# Patient Record
Sex: Female | Born: 1950 | Race: White | Hispanic: No | Marital: Married | Smoking: Former smoker
Health system: Southern US, Community
[De-identification: ages and names within clinical notes are randomized; demographics above are authoritative.]

## PROBLEM LIST (undated history)

## (undated) DIAGNOSIS — E7800 Pure hypercholesterolemia, unspecified: Secondary | ICD-10-CM

## (undated) DIAGNOSIS — I209 Angina pectoris, unspecified: Secondary | ICD-10-CM

## (undated) DIAGNOSIS — M1990 Unspecified osteoarthritis, unspecified site: Secondary | ICD-10-CM

## (undated) DIAGNOSIS — E669 Obesity, unspecified: Secondary | ICD-10-CM

## (undated) DIAGNOSIS — R011 Cardiac murmur, unspecified: Secondary | ICD-10-CM

## (undated) DIAGNOSIS — I10 Essential (primary) hypertension: Secondary | ICD-10-CM

## (undated) DIAGNOSIS — R7303 Prediabetes: Secondary | ICD-10-CM

## (undated) HISTORY — PX: JOINT REPLACEMENT: SHX530

## (undated) HISTORY — PX: TUBAL LIGATION: SHX77

## (undated) HISTORY — PX: UTERINE FIBROID SURGERY: SHX826

## (undated) HISTORY — PX: OTHER SURGICAL HISTORY: SHX169

## (undated) HISTORY — PX: EYE SURGERY: SHX253

---

## 1984-11-21 HISTORY — PX: TUBAL LIGATION: SHX77

## 2008-11-21 HISTORY — PX: COLONOSCOPY: SHX174

## 2015-08-22 HISTORY — PX: EYE SURGERY: SHX253

## 2015-08-22 HISTORY — PX: CATARACT EXTRACTION: SUR2

## 2017-06-22 ENCOUNTER — Other Ambulatory Visit: Payer: Self-pay | Admitting: Family Medicine

## 2017-06-22 DIAGNOSIS — Z1231 Encounter for screening mammogram for malignant neoplasm of breast: Secondary | ICD-10-CM

## 2017-07-06 ENCOUNTER — Ambulatory Visit
Admission: RE | Admit: 2017-07-06 | Discharge: 2017-07-06 | Disposition: A | Discharge status: Home / Self Care | Payer: Medicare HMO | Source: Ambulatory Visit | Attending: Family Medicine | Admitting: Family Medicine

## 2017-07-06 DIAGNOSIS — R928 Other abnormal and inconclusive findings on diagnostic imaging of breast: Secondary | ICD-10-CM | POA: Diagnosis not present

## 2017-07-06 DIAGNOSIS — Z1231 Encounter for screening mammogram for malignant neoplasm of breast: Secondary | ICD-10-CM | POA: Diagnosis not present

## 2017-07-27 ENCOUNTER — Other Ambulatory Visit: Payer: Self-pay | Admitting: Family Medicine

## 2017-07-27 DIAGNOSIS — N6320 Unspecified lump in the left breast, unspecified quadrant: Secondary | ICD-10-CM

## 2017-07-27 DIAGNOSIS — N6310 Unspecified lump in the right breast, unspecified quadrant: Secondary | ICD-10-CM

## 2017-07-27 DIAGNOSIS — R928 Other abnormal and inconclusive findings on diagnostic imaging of breast: Secondary | ICD-10-CM

## 2017-08-04 ENCOUNTER — Ambulatory Visit
Admission: RE | Admit: 2017-08-04 | Discharge: 2017-08-04 | Disposition: A | Discharge status: Home / Self Care | Payer: Medicare HMO | Source: Ambulatory Visit | Attending: Family Medicine | Admitting: Family Medicine

## 2017-08-04 ENCOUNTER — Other Ambulatory Visit: Payer: Self-pay | Admitting: Family Medicine

## 2017-08-04 DIAGNOSIS — R928 Other abnormal and inconclusive findings on diagnostic imaging of breast: Secondary | ICD-10-CM

## 2017-08-04 DIAGNOSIS — N6320 Unspecified lump in the left breast, unspecified quadrant: Secondary | ICD-10-CM

## 2017-08-04 DIAGNOSIS — N6310 Unspecified lump in the right breast, unspecified quadrant: Secondary | ICD-10-CM | POA: Diagnosis present

## 2017-08-04 DIAGNOSIS — N6321 Unspecified lump in the left breast, upper outer quadrant: Secondary | ICD-10-CM | POA: Diagnosis not present

## 2017-08-04 HISTORY — PX: BREAST BIOPSY: SHX20

## 2017-08-08 LAB — SURGICAL PATHOLOGY

## 2017-08-11 ENCOUNTER — Encounter: Payer: Self-pay | Admitting: *Deleted

## 2017-08-14 ENCOUNTER — Ambulatory Visit
Admission: RE | Admit: 2017-08-14 | Discharge: 2017-08-14 | Disposition: A | Discharge status: Home / Self Care | Payer: Medicare HMO | Source: Ambulatory Visit | Attending: Unknown Physician Specialty | Admitting: Unknown Physician Specialty

## 2017-08-14 ENCOUNTER — Encounter
Admission: RE | Disposition: A | Discharge status: Home / Self Care | Payer: Self-pay | Source: Ambulatory Visit | Attending: Unknown Physician Specialty

## 2017-08-14 ENCOUNTER — Ambulatory Visit: Payer: Medicare HMO | Admitting: Anesthesiology

## 2017-08-14 ENCOUNTER — Encounter: Payer: Self-pay | Admitting: *Deleted

## 2017-08-14 DIAGNOSIS — K640 First degree hemorrhoids: Secondary | ICD-10-CM | POA: Insufficient documentation

## 2017-08-14 DIAGNOSIS — Z1211 Encounter for screening for malignant neoplasm of colon: Secondary | ICD-10-CM | POA: Diagnosis present

## 2017-08-14 DIAGNOSIS — D125 Benign neoplasm of sigmoid colon: Secondary | ICD-10-CM | POA: Diagnosis not present

## 2017-08-14 DIAGNOSIS — K635 Polyp of colon: Secondary | ICD-10-CM | POA: Insufficient documentation

## 2017-08-14 DIAGNOSIS — Z87891 Personal history of nicotine dependence: Secondary | ICD-10-CM | POA: Diagnosis not present

## 2017-08-14 DIAGNOSIS — E7800 Pure hypercholesterolemia, unspecified: Secondary | ICD-10-CM | POA: Insufficient documentation

## 2017-08-14 DIAGNOSIS — I10 Essential (primary) hypertension: Secondary | ICD-10-CM | POA: Insufficient documentation

## 2017-08-14 DIAGNOSIS — R7303 Prediabetes: Secondary | ICD-10-CM | POA: Insufficient documentation

## 2017-08-14 DIAGNOSIS — Z79899 Other long term (current) drug therapy: Secondary | ICD-10-CM | POA: Insufficient documentation

## 2017-08-14 DIAGNOSIS — Z6830 Body mass index (BMI) 30.0-30.9, adult: Secondary | ICD-10-CM | POA: Insufficient documentation

## 2017-08-14 DIAGNOSIS — Z800 Family history of malignant neoplasm of digestive organs: Secondary | ICD-10-CM | POA: Diagnosis not present

## 2017-08-14 DIAGNOSIS — E669 Obesity, unspecified: Secondary | ICD-10-CM | POA: Diagnosis not present

## 2017-08-14 HISTORY — DX: Pure hypercholesterolemia, unspecified: E78.00

## 2017-08-14 HISTORY — DX: Prediabetes: R73.03

## 2017-08-14 HISTORY — DX: Obesity, unspecified: E66.9

## 2017-08-14 HISTORY — DX: Essential (primary) hypertension: I10

## 2017-08-14 HISTORY — PX: COLONOSCOPY WITH PROPOFOL: SHX5780

## 2017-08-14 SURGERY — COLONOSCOPY WITH PROPOFOL
Anesthesia: General

## 2017-08-14 MED ORDER — PROPOFOL 500 MG/50ML IV EMUL
INTRAVENOUS | Status: DC | PRN
  Administered 2017-08-14: 110 ug/kg/min via INTRAVENOUS

## 2017-08-14 MED ORDER — SODIUM CHLORIDE 0.9 % IV SOLN
INTRAVENOUS | Status: DC
  Administered 2017-08-14: 1000 mL via INTRAVENOUS

## 2017-08-14 MED ORDER — PROPOFOL 10 MG/ML IV BOLUS
INTRAVENOUS | Status: DC | PRN
  Administered 2017-08-14: 80 mg via INTRAVENOUS

## 2017-08-14 NOTE — H&P (Signed)
   Primary Care Physician:  Katheren Shams Primary Gastroenterologist:  Dr. Vira Agar  Pre-Procedure History & Physical: HPI:  Tracy Hogan is a 66 y.o. female is here for an colonoscopy.   Past Medical History:  Diagnosis Date  . Hypercholesterolemia   . Hypertension   . Obesity (BMI 30-39.9)   . Pre-diabetes     Past Surgical History:  Procedure Laterality Date  . BREAST BIOPSY Left 08/04/2017   left [place] bx path pending  . EYE SURGERY    . TUBAL LIGATION    . uterine fibroid tumor extraction      Prior to Admission medications   Medication Sig Start Date End Date Taking? Authorizing Provider  Cholecalciferol 1000 units tablet Take 1,000 Units by mouth daily.   Yes [provider]  lisinopril (PRINIVIL,ZESTRIL) 10 MG tablet Take 10 mg by mouth daily.   Yes [provider]  loratadine (CLARITIN) 10 MG tablet Take 10 mg by mouth daily.   Yes [provider]  Multiple Vitamin (MULTIVITAMIN) capsule Take 1 capsule by mouth daily.   Yes [provider]  ranitidine (ZANTAC) 75 MG tablet Take 75 mg by mouth daily as needed for heartburn.   Yes [provider]    Allergies as of 05/08/2017  . (Not on File)    Family History  Problem Relation Age of Onset  . Breast cancer Mother 11    Social History   Social History  . Marital status: Married    Spouse name: N/A  . Number of children: N/A  . Years of education: N/A   Occupational History  . Not on file.   Social History Main Topics  . Smoking status: Former Smoker    Packs/day: 1.00    Years: 10.00    Quit date: 11/21/1978  . Smokeless tobacco: Never Used  . Alcohol use Yes     Comment: occasional  . Drug use: No  . Sexual activity: Not on file   Other Topics Concern  . Not on file   Social History Narrative  . No narrative on file    Review of Systems: See HPI, otherwise negative ROS  Physical Exam: BP (!) 142/83   Pulse 73   Temp (!) 97.5 F (36.4  C) (Tympanic)   Resp 18   Ht 5\' 5"  (1.651 m)   Wt 80.3 kg (177 lb)   SpO2 100%   BMI 29.45 kg/m  General:   Alert,  pleasant and cooperative in NAD Head:  Normocephalic and atraumatic. Neck:  Supple; no masses or thyromegaly. Lungs:  Clear throughout to auscultation.    Heart:  Regular rate and rhythm. Abdomen:  Soft, nontender and nondistended. Normal bowel sounds, without guarding, and without rebound.   Neurologic:  Alert and  oriented x4;  grossly normal neurologically.  Impression/Plan: [name] is here for an colonoscopy to be performed for FH colon cancer in her father.  Risks, benefits, limitations, and alternatives regarding  colonoscopy have been reviewed with the patient.  Questions have been answered.  All parties agreeable.   Gaylyn Cheers, MD  08/14/2017, 3:42 PM

## 2017-08-14 NOTE — Op Note (Signed)
[facility] Gastroenterology Patient Name: Tracy Hogan Procedure Date: 08/14/2017 3:43 PM MRN: [id number] Account #: [id number] Date of Birth: [birth year] Admit Type: Outpatient Age: 66 Room: [facility] ENDO ROOM 3 Gender: Female Note Status: Finalized Procedure:            Colonoscopy Indications:          Screening in patient at increased risk: Family history                        of 1st-degree relative with colorectal cancer Providers:            Manya Silvas, MD Referring MD:         No Local Md, MD (Referring MD) Medicines:            Propofol per Anesthesia Complications:        No immediate complications. Procedure:            Pre-Anesthesia Assessment:                       - After reviewing the risks and benefits, the patient                        was deemed in satisfactory condition to undergo the                        procedure.                       After obtaining informed consent, the colonoscope was                        passed under direct vision. Throughout the procedure,                        the patient's blood pressure, pulse, and oxygen                        saturations were monitored continuously. The                        Colonoscope was introduced through the anus and                        advanced to the the cecum, identified by appendiceal                        orifice and ileocecal valve. The colonoscopy was                        performed without difficulty. The patient tolerated the                        procedure well. The quality of the bowel preparation                        was excellent. Findings:      A diminutive polyp was found in the hepatic flexure. The polyp was       sessile. The polyp was removed with a jumbo cold forceps. Resection and       retrieval were complete.  A diminutive polyp was found in the transverse colon. The polyp was       sessile. The polyp was removed with a jumbo cold forceps.  Resection and       retrieval were complete.      A small polyp was found in the sigmoid colon. The polyp was sessile. The       polyp was removed with a hot snare. Resection and retrieval were       complete.      Internal hemorrhoids were found during endoscopy. The hemorrhoids were       small, medium-sized and Grade I (internal hemorrhoids that do not       prolapse). Impression:           - One diminutive polyp at the hepatic flexure, removed                        with a jumbo cold forceps. Resected and retrieved.                       - One diminutive polyp in the transverse colon, removed                        with a jumbo cold forceps. Resected and retrieved.                       - One small polyp in the sigmoid colon, removed with a                        hot snare. Resected and retrieved.                       - Internal hemorrhoids. Recommendation:       - Await pathology results. Manya Silvas, MD 08/14/2017 4:25:22 PM This report has been signed electronically. Number of Addenda: 0 Note Initiated On: 08/14/2017 3:43 PM Scope Withdrawal Time: 0 hours 18 minutes 59 seconds  Total Procedure Duration: 0 hours 30 minutes 15 seconds       [facility]

## 2017-08-14 NOTE — Anesthesia Post-op Follow-up Note (Signed)
Anesthesia QCDR form completed.        

## 2017-08-14 NOTE — Anesthesia Postprocedure Evaluation (Signed)
Anesthesia Post Note  Patient: Tracy Hogan  Procedure(s) Performed: Procedure(s) (LRB): COLONOSCOPY WITH PROPOFOL (N/A)  Patient location during evaluation: Endoscopy Anesthesia Type: General Level of consciousness: awake and alert Pain management: pain level controlled Vital Signs Assessment: post-procedure vital signs reviewed and stable Respiratory status: spontaneous breathing, nonlabored ventilation, respiratory function stable and patient connected to nasal cannula oxygen Cardiovascular status: blood pressure returned to baseline and stable Postop Assessment: no apparent nausea or vomiting Anesthetic complications: no     Last Vitals:  Vitals:   08/14/17 1645 08/14/17 1655  BP: (!) 164/98 (!) 163/77  Pulse: 82 78  Resp: 16 16  Temp:    SpO2:      Last Pain:  Vitals:   08/14/17 1625  TempSrc: Tympanic                 Precious Haws Piscitello

## 2017-08-14 NOTE — Transfer of Care (Signed)
Immediate Anesthesia Transfer of Care Note  Patient: Tracy Hogan  Procedure(s) Performed: Procedure(s): COLONOSCOPY WITH PROPOFOL (N/A)  Patient Location: Endoscopy Unit  Anesthesia Type:General  Level of Consciousness: drowsy and patient cooperative  Airway & Oxygen Therapy: Patient Spontanous Breathing and Patient connected to nasal cannula oxygen  Post-op Assessment: Report given to RN and Post -op Vital signs reviewed and stable  Post vital signs: Reviewed and stable  Last Vitals:  Vitals:   08/14/17 1423 08/14/17 1628  BP: (!) 142/83 (!) 167/82  Pulse: 73 77  Resp: 18 16  Temp: (!) 36.4 C   SpO2: 100% 99%    Last Pain:  Vitals:   08/14/17 1423  TempSrc: Tympanic      Patients Stated Pain Goal: 0 (25/27/12 9290)  Complications: No apparent anesthesia complications

## 2017-08-14 NOTE — Anesthesia Preprocedure Evaluation (Signed)
Anesthesia Evaluation  Patient identified by MRN, date of birth, ID band Patient awake    Reviewed: Allergy & Precautions, H&P , NPO status , Patient's Chart, lab work & pertinent test results  History of Anesthesia Complications Negative for: history of anesthetic complications  Airway Mallampati: III  TM Distance: <3 FB Neck ROM: limited    Dental  (+) Chipped   Pulmonary neg shortness of breath, former smoker,           Cardiovascular Exercise Tolerance: Good hypertension,      Neuro/Psych negative neurological ROS  negative psych ROS   GI/Hepatic negative GI ROS, Neg liver ROS, neg GERD  ,  Endo/Other  negative endocrine ROS  Renal/GU negative Renal ROS  negative genitourinary   Musculoskeletal   Abdominal   Peds  Hematology negative hematology ROS (+)   Anesthesia Other Findings Past Medical History: No date: Hypercholesterolemia No date: Hypertension No date: Obesity (BMI 30-39.9) No date: Pre-diabetes  Past Surgical History: 08/04/2017: BREAST BIOPSY; Left     Comment:  left [place] bx path pending No date: EYE SURGERY No date: TUBAL LIGATION No date: uterine fibroid tumor extraction  BMI    Body Mass Index:  29.45 kg/m      Reproductive/Obstetrics negative OB ROS                             Anesthesia Physical Anesthesia Plan  ASA: III  Anesthesia Plan: General   Post-op Pain Management:    Induction: Intravenous  PONV Risk Score and Plan: Propofol infusion  Airway Management Planned: Natural Airway and Nasal Cannula  Additional Equipment:   Intra-op Plan:   Post-operative Plan:   Informed Consent: I have reviewed the patients History and Physical, chart, labs and discussed the procedure including the risks, benefits and alternatives for the proposed anesthesia with the patient or authorized representative who has indicated his/her understanding and  acceptance.   Dental Advisory Given  Plan Discussed with: Anesthesiologist, CRNA and Surgeon  Anesthesia Plan Comments: (Patient consented for risks of anesthesia including but not limited to:  - adverse reactions to medications - risk of intubation if required - damage to teeth, lips or other oral mucosa - sore throat or hoarseness - Damage to heart, brain, lungs or loss of life  Patient voiced understanding.)        Anesthesia Quick Evaluation

## 2017-08-15 ENCOUNTER — Encounter: Payer: Self-pay | Admitting: Unknown Physician Specialty

## 2017-08-17 LAB — SURGICAL PATHOLOGY

## 2018-10-02 ENCOUNTER — Other Ambulatory Visit: Payer: Self-pay | Admitting: Family Medicine

## 2018-10-02 DIAGNOSIS — N6320 Unspecified lump in the left breast, unspecified quadrant: Secondary | ICD-10-CM

## 2018-10-20 IMAGING — MG MM DIGITAL DIAGNOSTIC BILAT W/ TOMO W/ CAD
8 of 12 series · 8 of 28 positions shown · non-contrast
Comparison: Previous exam(s).

CLINICAL DATA: Bilateral possible masses seen on most recent
screening mammography.

EXAM:
2D DIGITAL DIAGNOSTIC BILATERAL MAMMOGRAM WITH CAD AND ADJUNCT TOMO
ULTRASOUND BILATERAL BREAST

[R CC]
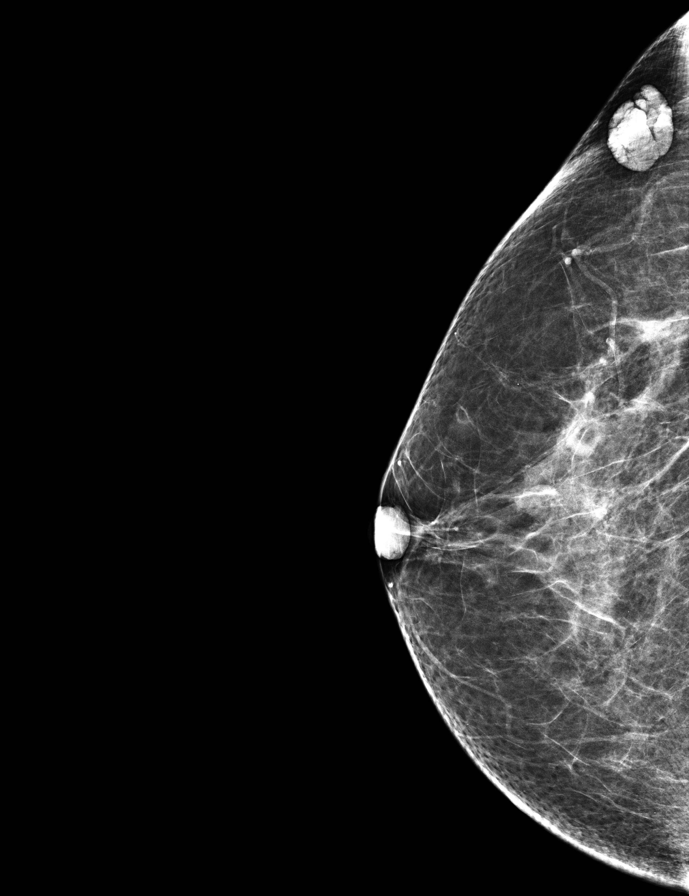

[R MLO]
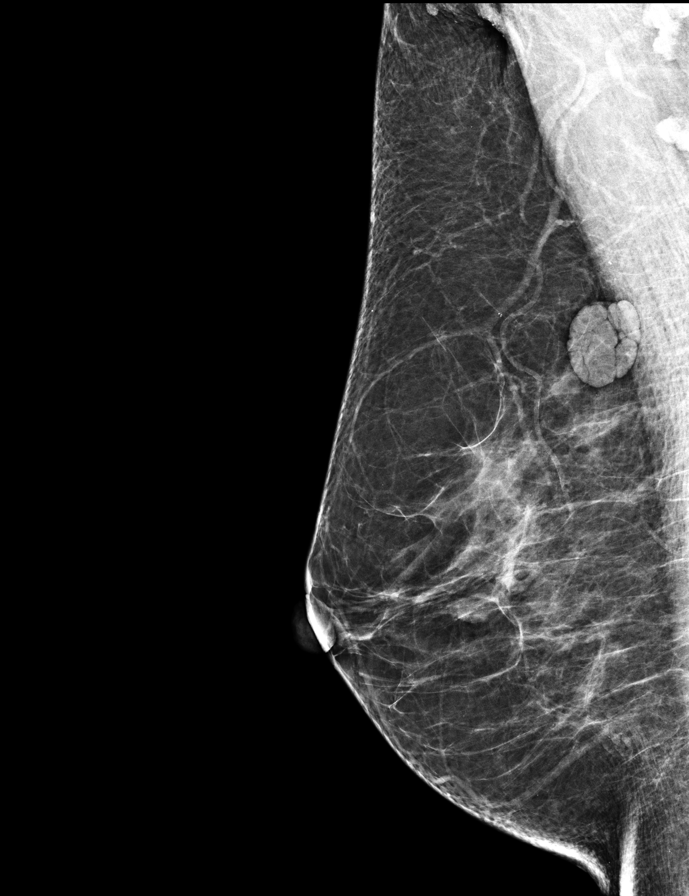

[L MLO]
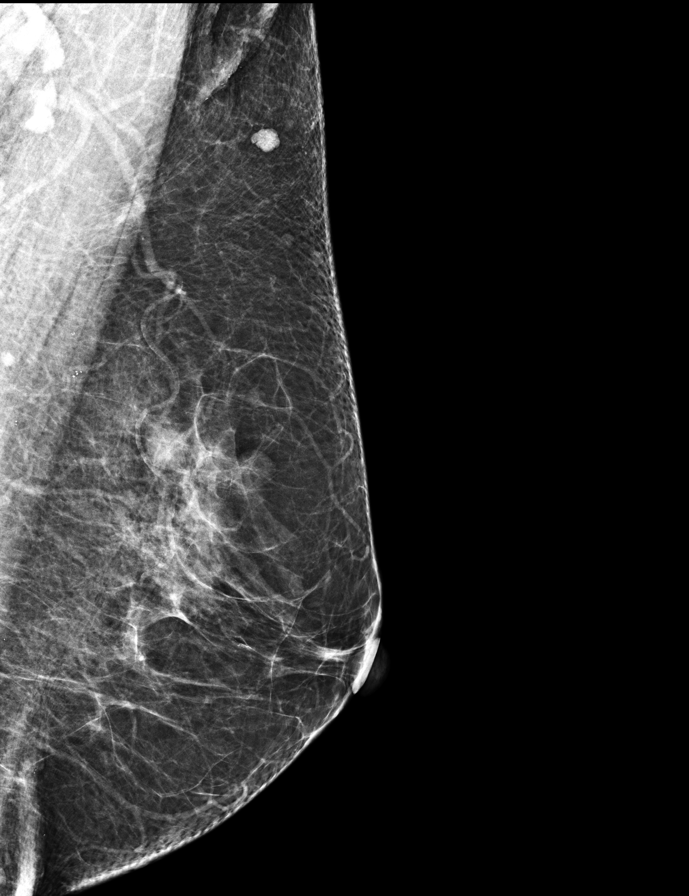

[R MLO synth-2D]
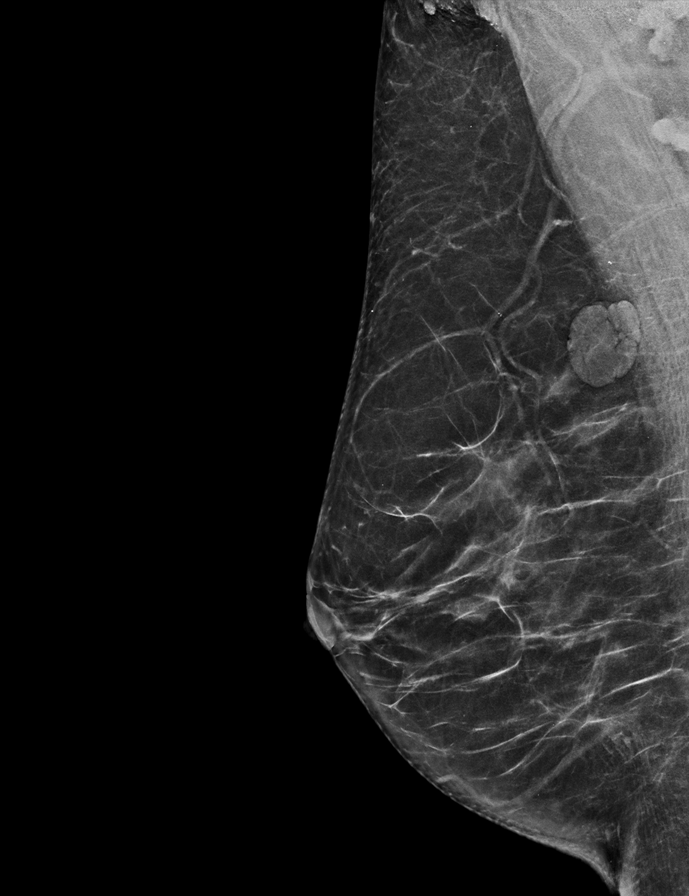

[L CC]
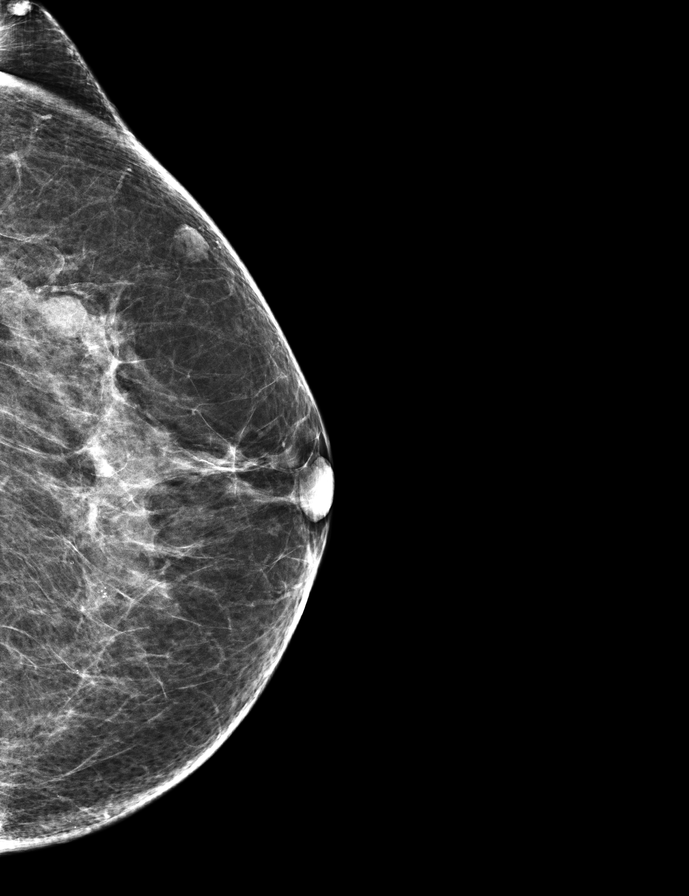

[L CC synth-2D]
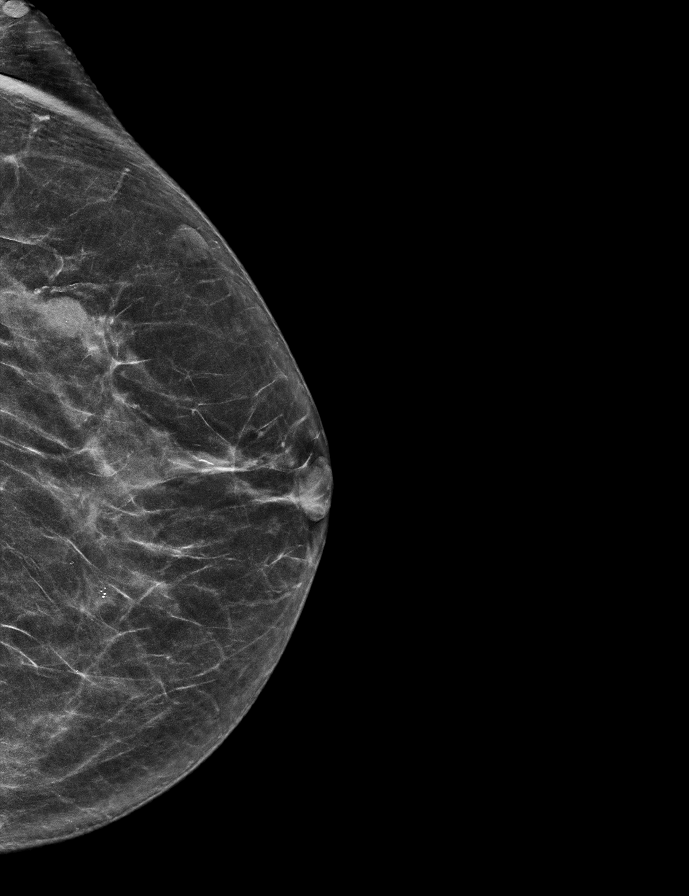

[R CC synth-2D]
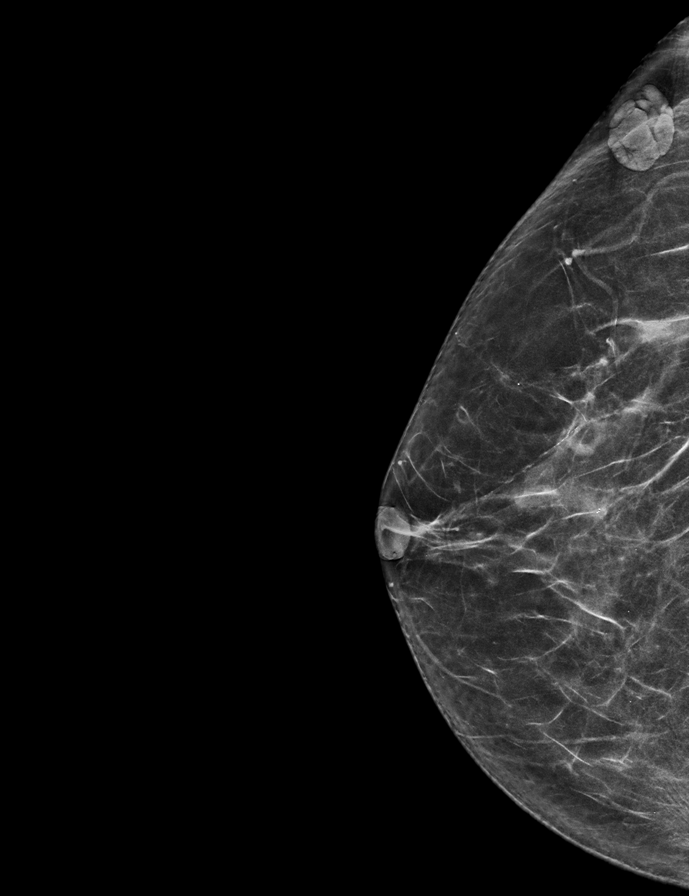

[L MLO synth-2D]
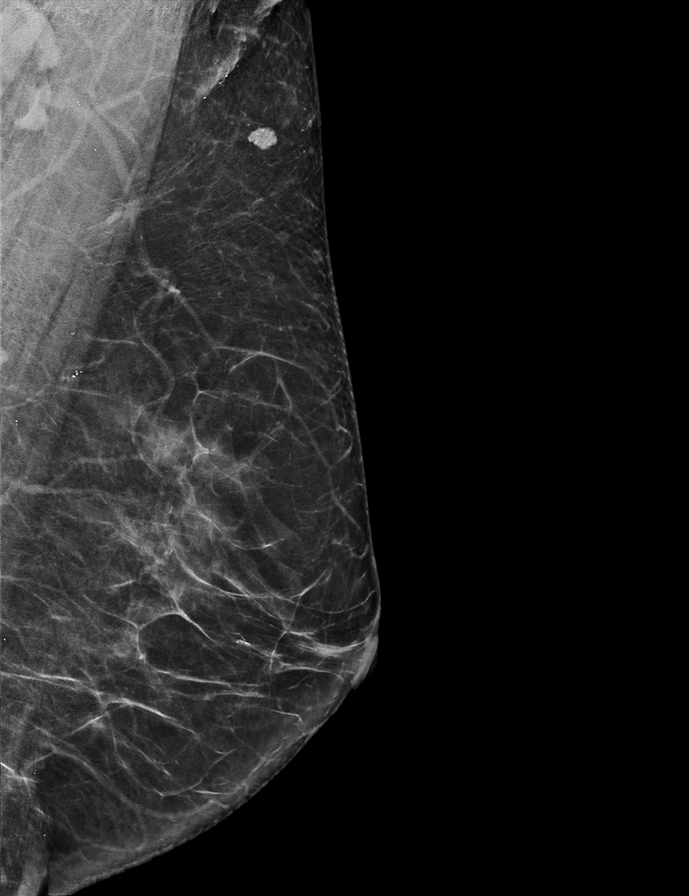

[8 of 28 positions shown; findings below may reference images not displayed]

ACR Breast Density Category b: There are scattered areas of
fibroglandular density.
FINDINGS: Additional bilateral mammographic views demonstrate persistent mass
in the left breast upper outer quadrant, posterior depth, measuring
13 mm in maximum diameter, which contains faint microcalcifications.
In the right breast slightly lower outer quadrant, middle depth,
there is a low-density circumscribed fat containing benign-appearing
mass.

Mammographic images were processed with CAD.

On physical exam, no suspicious masses are palpated.

Targeted left breast ultrasound is performed, showing 2 o'clock 6 cm
from the nipple measuring 1.1 x 1.2 x 0.7 cm. Intramural
calcifications are noted. This finding corresponds to the
mammographically seen mass in the upper outer quadrant.

Targeted right breast ultrasound is performed, showing right breast
830 o'clock 2 cm from the nipple benign-appearing intramammary lymph
node measuring 8 mm in long-axis. This finding corresponds to the
mammographically seen low-density benign-appearing mass.
IMPRESSION: Left breast 2 o'clock indeterminate mass, for which
ultrasound-guided core needle biopsy is recommended.

Benign right intramammary lymph node. No evidence of right breast
malignancy.

RECOMMENDATION:
Ultrasound-guided core needle biopsy of the left breast.

I have discussed the findings and recommendations with the patient.
Results were also provided in writing at the conclusion of the
visit. If applicable, a reminder letter will be sent to the patient
regarding the next appointment.

BI-RADS CATEGORY  4: Suspicious.

## 2018-10-20 IMAGING — MG MM BREAST LOCALIZATION CLIP
2 series · 2 of 2 positions shown · non-contrast
Comparison: Previous exam(s).

CLINICAL DATA: Post ultrasound-guided core needle biopsy of left
breast 2 o'clock mass.

EXAM:
DIAGNOSTIC LEFT MAMMOGRAM POST ULTRASOUND BIOPSY

[L ML]
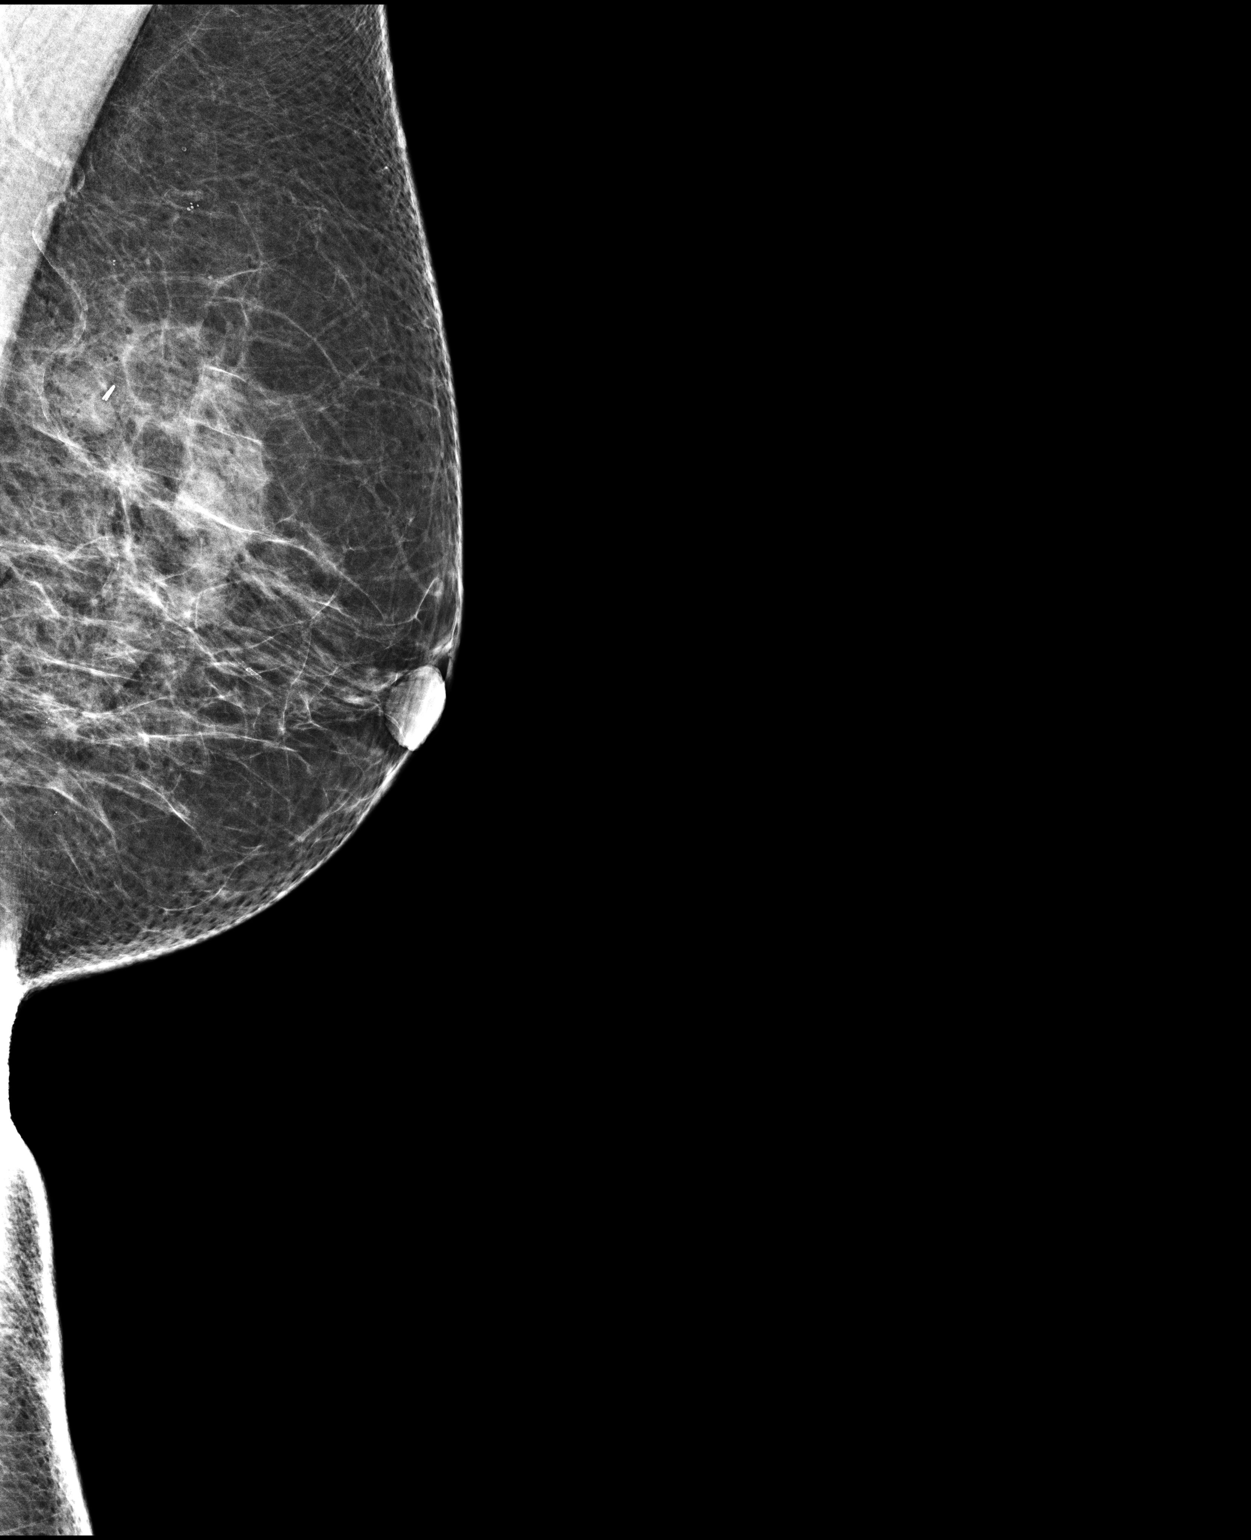

[L CC]
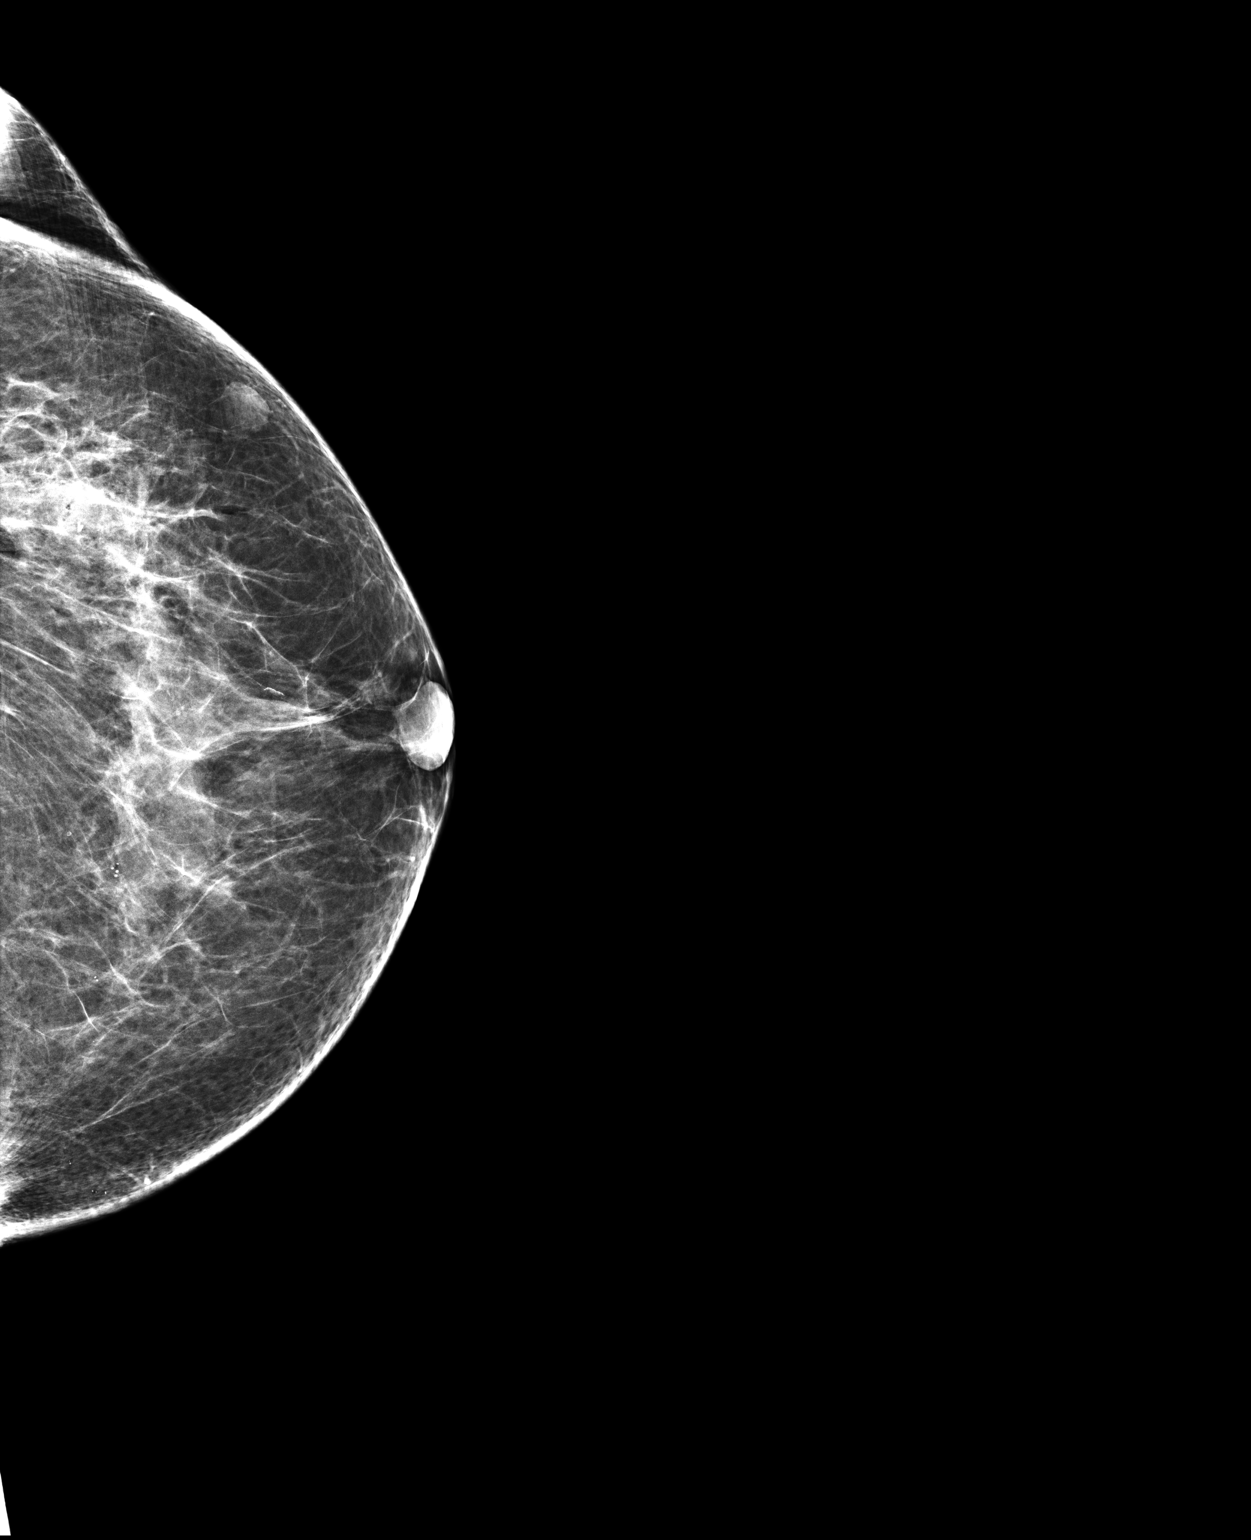

[2 of 2 positions shown; findings below may reference images not displayed]

FINDINGS: Mammographic images were obtained following ultrasound guided biopsy
of left breast 2 o'clock mass. Two-view mammography demonstrates
presence of ribbon shaped marker within the sampled mass in the left
2 o'clock breast. Expected post biopsy changes are seen.
IMPRESSION: Successful placement of ribbon shaped marker, post ultrasound-guided
core needle biopsy of left breast 2 o'clock mass.

Final Assessment: Post Procedure Mammograms for Marker Placement

## 2018-11-16 ENCOUNTER — Ambulatory Visit
Admission: RE | Admit: 2018-11-16 | Discharge: 2018-11-16 | Disposition: A | Discharge status: Home / Self Care | Payer: Medicare HMO | Source: Ambulatory Visit | Attending: Family Medicine | Admitting: Family Medicine

## 2018-11-16 DIAGNOSIS — N6320 Unspecified lump in the left breast, unspecified quadrant: Secondary | ICD-10-CM | POA: Diagnosis present

## 2019-04-30 DIAGNOSIS — E7800 Pure hypercholesterolemia, unspecified: Secondary | ICD-10-CM | POA: Diagnosis not present

## 2019-04-30 DIAGNOSIS — I10 Essential (primary) hypertension: Secondary | ICD-10-CM | POA: Diagnosis not present

## 2019-04-30 DIAGNOSIS — Z0000 Encounter for general adult medical examination without abnormal findings: Secondary | ICD-10-CM | POA: Diagnosis not present

## 2019-09-09 DIAGNOSIS — Z0000 Encounter for general adult medical examination without abnormal findings: Secondary | ICD-10-CM | POA: Diagnosis not present

## 2019-09-09 DIAGNOSIS — E7800 Pure hypercholesterolemia, unspecified: Secondary | ICD-10-CM | POA: Diagnosis not present

## 2019-09-09 DIAGNOSIS — Z87891 Personal history of nicotine dependence: Secondary | ICD-10-CM | POA: Diagnosis not present

## 2019-11-28 ENCOUNTER — Other Ambulatory Visit: Payer: Self-pay | Admitting: Family Medicine

## 2019-11-28 DIAGNOSIS — Z1231 Encounter for screening mammogram for malignant neoplasm of breast: Secondary | ICD-10-CM

## 2019-12-25 ENCOUNTER — Ambulatory Visit
Admission: RE | Admit: 2019-12-25 | Discharge: 2019-12-25 | Disposition: A | Discharge status: Home / Self Care | Payer: Medicare HMO | Source: Ambulatory Visit | Attending: Family Medicine | Admitting: Family Medicine

## 2019-12-25 DIAGNOSIS — Z1231 Encounter for screening mammogram for malignant neoplasm of breast: Secondary | ICD-10-CM | POA: Insufficient documentation

## 2019-12-27 ENCOUNTER — Other Ambulatory Visit: Payer: Self-pay | Admitting: Family Medicine

## 2019-12-27 DIAGNOSIS — R928 Other abnormal and inconclusive findings on diagnostic imaging of breast: Secondary | ICD-10-CM

## 2019-12-27 DIAGNOSIS — N6489 Other specified disorders of breast: Secondary | ICD-10-CM

## 2020-01-02 ENCOUNTER — Ambulatory Visit
Admission: RE | Admit: 2020-01-02 | Discharge: 2020-01-02 | Disposition: A | Discharge status: Home / Self Care | Payer: Medicare HMO | Source: Ambulatory Visit | Attending: Family Medicine | Admitting: Family Medicine

## 2020-01-02 DIAGNOSIS — R922 Inconclusive mammogram: Secondary | ICD-10-CM | POA: Diagnosis not present

## 2020-01-02 DIAGNOSIS — N6489 Other specified disorders of breast: Secondary | ICD-10-CM

## 2020-01-02 DIAGNOSIS — R928 Other abnormal and inconclusive findings on diagnostic imaging of breast: Secondary | ICD-10-CM | POA: Diagnosis not present

## 2020-03-04 DIAGNOSIS — I10 Essential (primary) hypertension: Secondary | ICD-10-CM | POA: Diagnosis not present

## 2020-03-04 DIAGNOSIS — E7800 Pure hypercholesterolemia, unspecified: Secondary | ICD-10-CM | POA: Diagnosis not present

## 2020-03-11 DIAGNOSIS — I10 Essential (primary) hypertension: Secondary | ICD-10-CM | POA: Diagnosis not present

## 2020-03-11 DIAGNOSIS — Z87891 Personal history of nicotine dependence: Secondary | ICD-10-CM | POA: Diagnosis not present

## 2020-03-11 DIAGNOSIS — E7800 Pure hypercholesterolemia, unspecified: Secondary | ICD-10-CM | POA: Diagnosis not present

## 2020-03-11 DIAGNOSIS — Z0000 Encounter for general adult medical examination without abnormal findings: Secondary | ICD-10-CM | POA: Diagnosis not present

## 2020-03-11 DIAGNOSIS — Z79899 Other long term (current) drug therapy: Secondary | ICD-10-CM | POA: Diagnosis not present

## 2020-09-02 DIAGNOSIS — I10 Essential (primary) hypertension: Secondary | ICD-10-CM | POA: Diagnosis not present

## 2020-09-02 DIAGNOSIS — E7800 Pure hypercholesterolemia, unspecified: Secondary | ICD-10-CM | POA: Diagnosis not present

## 2020-09-09 DIAGNOSIS — Z0000 Encounter for general adult medical examination without abnormal findings: Secondary | ICD-10-CM | POA: Diagnosis not present

## 2020-09-09 DIAGNOSIS — E785 Hyperlipidemia, unspecified: Secondary | ICD-10-CM | POA: Diagnosis not present

## 2021-01-05 DIAGNOSIS — E7800 Pure hypercholesterolemia, unspecified: Secondary | ICD-10-CM | POA: Diagnosis not present

## 2021-01-12 DIAGNOSIS — Z23 Encounter for immunization: Secondary | ICD-10-CM | POA: Diagnosis not present

## 2021-01-12 DIAGNOSIS — L989 Disorder of the skin and subcutaneous tissue, unspecified: Secondary | ICD-10-CM | POA: Diagnosis not present

## 2021-01-12 DIAGNOSIS — I10 Essential (primary) hypertension: Secondary | ICD-10-CM | POA: Diagnosis not present

## 2021-01-12 DIAGNOSIS — E7800 Pure hypercholesterolemia, unspecified: Secondary | ICD-10-CM | POA: Diagnosis not present

## 2021-01-12 DIAGNOSIS — Z1159 Encounter for screening for other viral diseases: Secondary | ICD-10-CM | POA: Diagnosis not present

## 2021-01-15 DIAGNOSIS — L820 Inflamed seborrheic keratosis: Secondary | ICD-10-CM | POA: Diagnosis not present

## 2021-01-15 DIAGNOSIS — D485 Neoplasm of uncertain behavior of skin: Secondary | ICD-10-CM | POA: Diagnosis not present

## 2021-02-08 ENCOUNTER — Other Ambulatory Visit: Payer: Self-pay | Admitting: Family Medicine

## 2021-02-08 DIAGNOSIS — Z1231 Encounter for screening mammogram for malignant neoplasm of breast: Secondary | ICD-10-CM

## 2021-02-24 ENCOUNTER — Other Ambulatory Visit: Payer: Self-pay

## 2021-02-24 ENCOUNTER — Ambulatory Visit
Admission: RE | Admit: 2021-02-24 | Discharge: 2021-02-24 | Disposition: A | Discharge status: Home / Self Care | Payer: Medicare HMO | Source: Ambulatory Visit | Attending: Family Medicine | Admitting: Family Medicine

## 2021-02-24 DIAGNOSIS — Z1231 Encounter for screening mammogram for malignant neoplasm of breast: Secondary | ICD-10-CM | POA: Insufficient documentation

## 2021-07-05 DIAGNOSIS — E7800 Pure hypercholesterolemia, unspecified: Secondary | ICD-10-CM | POA: Diagnosis not present

## 2021-07-05 DIAGNOSIS — Z1159 Encounter for screening for other viral diseases: Secondary | ICD-10-CM | POA: Diagnosis not present

## 2021-07-12 DIAGNOSIS — E7800 Pure hypercholesterolemia, unspecified: Secondary | ICD-10-CM | POA: Diagnosis not present

## 2021-07-12 DIAGNOSIS — I10 Essential (primary) hypertension: Secondary | ICD-10-CM | POA: Diagnosis not present

## 2021-07-12 DIAGNOSIS — R079 Chest pain, unspecified: Secondary | ICD-10-CM | POA: Diagnosis not present

## 2021-08-05 DIAGNOSIS — R079 Chest pain, unspecified: Secondary | ICD-10-CM | POA: Diagnosis not present

## 2021-08-06 DIAGNOSIS — R9439 Abnormal result of other cardiovascular function study: Secondary | ICD-10-CM | POA: Diagnosis not present

## 2021-08-06 DIAGNOSIS — R079 Chest pain, unspecified: Secondary | ICD-10-CM | POA: Diagnosis not present

## 2021-08-06 DIAGNOSIS — I10 Essential (primary) hypertension: Secondary | ICD-10-CM | POA: Diagnosis not present

## 2021-08-06 DIAGNOSIS — E7800 Pure hypercholesterolemia, unspecified: Secondary | ICD-10-CM | POA: Diagnosis not present

## 2021-08-31 DIAGNOSIS — R079 Chest pain, unspecified: Secondary | ICD-10-CM | POA: Diagnosis not present

## 2021-08-31 DIAGNOSIS — R9439 Abnormal result of other cardiovascular function study: Secondary | ICD-10-CM | POA: Diagnosis not present

## 2021-08-31 DIAGNOSIS — I5189 Other ill-defined heart diseases: Secondary | ICD-10-CM

## 2021-08-31 HISTORY — DX: Other ill-defined heart diseases: I51.89

## 2021-09-07 DIAGNOSIS — Z23 Encounter for immunization: Secondary | ICD-10-CM | POA: Diagnosis not present

## 2021-09-07 DIAGNOSIS — E7800 Pure hypercholesterolemia, unspecified: Secondary | ICD-10-CM | POA: Diagnosis not present

## 2021-09-07 DIAGNOSIS — I10 Essential (primary) hypertension: Secondary | ICD-10-CM | POA: Diagnosis not present

## 2021-09-07 DIAGNOSIS — R9439 Abnormal result of other cardiovascular function study: Secondary | ICD-10-CM | POA: Diagnosis not present

## 2021-10-18 DIAGNOSIS — Z23 Encounter for immunization: Secondary | ICD-10-CM | POA: Diagnosis not present

## 2021-10-18 DIAGNOSIS — R9439 Abnormal result of other cardiovascular function study: Secondary | ICD-10-CM | POA: Diagnosis not present

## 2021-10-18 DIAGNOSIS — E7800 Pure hypercholesterolemia, unspecified: Secondary | ICD-10-CM | POA: Diagnosis not present

## 2021-10-18 DIAGNOSIS — I10 Essential (primary) hypertension: Secondary | ICD-10-CM | POA: Diagnosis not present

## 2021-10-18 DIAGNOSIS — R079 Chest pain, unspecified: Secondary | ICD-10-CM | POA: Diagnosis not present

## 2021-11-30 DIAGNOSIS — M1611 Unilateral primary osteoarthritis, right hip: Secondary | ICD-10-CM | POA: Diagnosis not present

## 2021-11-30 DIAGNOSIS — M25551 Pain in right hip: Secondary | ICD-10-CM | POA: Diagnosis not present

## 2021-12-13 DIAGNOSIS — E7800 Pure hypercholesterolemia, unspecified: Secondary | ICD-10-CM | POA: Diagnosis not present

## 2021-12-13 DIAGNOSIS — M25551 Pain in right hip: Secondary | ICD-10-CM | POA: Diagnosis not present

## 2021-12-13 DIAGNOSIS — Z23 Encounter for immunization: Secondary | ICD-10-CM | POA: Diagnosis not present

## 2021-12-13 DIAGNOSIS — M25651 Stiffness of right hip, not elsewhere classified: Secondary | ICD-10-CM | POA: Diagnosis not present

## 2021-12-13 DIAGNOSIS — M1611 Unilateral primary osteoarthritis, right hip: Secondary | ICD-10-CM | POA: Diagnosis not present

## 2021-12-13 DIAGNOSIS — I10 Essential (primary) hypertension: Secondary | ICD-10-CM | POA: Diagnosis not present

## 2021-12-13 DIAGNOSIS — R29898 Other symptoms and signs involving the musculoskeletal system: Secondary | ICD-10-CM | POA: Diagnosis not present

## 2021-12-15 DIAGNOSIS — M1611 Unilateral primary osteoarthritis, right hip: Secondary | ICD-10-CM | POA: Diagnosis not present

## 2021-12-20 DIAGNOSIS — M1611 Unilateral primary osteoarthritis, right hip: Secondary | ICD-10-CM | POA: Diagnosis not present

## 2021-12-23 DIAGNOSIS — M1611 Unilateral primary osteoarthritis, right hip: Secondary | ICD-10-CM | POA: Diagnosis not present

## 2021-12-28 DIAGNOSIS — M1611 Unilateral primary osteoarthritis, right hip: Secondary | ICD-10-CM | POA: Diagnosis not present

## 2022-01-11 DIAGNOSIS — E7800 Pure hypercholesterolemia, unspecified: Secondary | ICD-10-CM | POA: Diagnosis not present

## 2022-01-11 DIAGNOSIS — I10 Essential (primary) hypertension: Secondary | ICD-10-CM | POA: Diagnosis not present

## 2022-01-18 DIAGNOSIS — E669 Obesity, unspecified: Secondary | ICD-10-CM | POA: Diagnosis not present

## 2022-01-18 DIAGNOSIS — Z6833 Body mass index (BMI) 33.0-33.9, adult: Secondary | ICD-10-CM | POA: Diagnosis not present

## 2022-01-18 DIAGNOSIS — E785 Hyperlipidemia, unspecified: Secondary | ICD-10-CM | POA: Diagnosis not present

## 2022-01-18 DIAGNOSIS — Z0000 Encounter for general adult medical examination without abnormal findings: Secondary | ICD-10-CM | POA: Diagnosis not present

## 2022-01-18 DIAGNOSIS — I10 Essential (primary) hypertension: Secondary | ICD-10-CM | POA: Diagnosis not present

## 2022-01-18 DIAGNOSIS — Z1389 Encounter for screening for other disorder: Secondary | ICD-10-CM | POA: Diagnosis not present

## 2022-01-25 DIAGNOSIS — M1611 Unilateral primary osteoarthritis, right hip: Secondary | ICD-10-CM | POA: Diagnosis not present

## 2022-01-26 ENCOUNTER — Other Ambulatory Visit: Payer: Self-pay | Admitting: Family Medicine

## 2022-01-26 DIAGNOSIS — Z1231 Encounter for screening mammogram for malignant neoplasm of breast: Secondary | ICD-10-CM

## 2022-02-07 ENCOUNTER — Other Ambulatory Visit: Payer: Self-pay | Admitting: Orthopedic Surgery

## 2022-02-07 DIAGNOSIS — M1611 Unilateral primary osteoarthritis, right hip: Secondary | ICD-10-CM | POA: Diagnosis not present

## 2022-02-14 ENCOUNTER — Other Ambulatory Visit: Payer: Self-pay

## 2022-02-14 ENCOUNTER — Encounter
Admission: RE | Admit: 2022-02-14 | Discharge: 2022-02-14 | Disposition: A | Discharge status: Home / Self Care | Payer: Medicare HMO | Source: Ambulatory Visit | Attending: Orthopedic Surgery | Admitting: Orthopedic Surgery

## 2022-02-14 DIAGNOSIS — Z01818 Encounter for other preprocedural examination: Secondary | ICD-10-CM | POA: Insufficient documentation

## 2022-02-14 DIAGNOSIS — Z01810 Encounter for preprocedural cardiovascular examination: Secondary | ICD-10-CM | POA: Diagnosis not present

## 2022-02-14 DIAGNOSIS — Z1389 Encounter for screening for other disorder: Secondary | ICD-10-CM | POA: Diagnosis not present

## 2022-02-14 DIAGNOSIS — R8290 Unspecified abnormal findings in urine: Secondary | ICD-10-CM | POA: Diagnosis not present

## 2022-02-14 DIAGNOSIS — Z01812 Encounter for preprocedural laboratory examination: Secondary | ICD-10-CM

## 2022-02-14 HISTORY — DX: Cardiac murmur, unspecified: R01.1

## 2022-02-14 LAB — TYPE AND SCREEN
ABO/RH(D): A POS
Antibody Screen: NEGATIVE

## 2022-02-14 LAB — CBC WITH DIFFERENTIAL/PLATELET
Abs Immature Granulocytes: 0.01 10*3/uL (ref 0.00–0.07)
Basophils Absolute: 0.1 10*3/uL (ref 0.0–0.1)
Basophils Relative: 1 %
Eosinophils Absolute: 0.1 10*3/uL (ref 0.0–0.5)
Eosinophils Relative: 2 %
HCT: 38.5 % (ref 36.0–46.0)
Hemoglobin: 12.5 g/dL (ref 12.0–15.0)
Immature Granulocytes: 0 %
Lymphocytes Relative: 29 %
Lymphs Abs: 1.7 10*3/uL (ref 0.7–4.0)
MCH: 28.7 pg (ref 26.0–34.0)
MCHC: 32.5 g/dL (ref 30.0–36.0)
MCV: 88.5 fL (ref 80.0–100.0)
Monocytes Absolute: 0.5 10*3/uL (ref 0.1–1.0)
Monocytes Relative: 8 %
Neutro Abs: 3.6 10*3/uL (ref 1.7–7.7)
Neutrophils Relative %: 60 %
Platelets: 237 10*3/uL (ref 150–400)
RBC: 4.35 MIL/uL (ref 3.87–5.11)
RDW: 12.6 % (ref 11.5–15.5)
WBC: 6 10*3/uL (ref 4.0–10.5)
nRBC: 0 % (ref 0.0–0.2)

## 2022-02-14 LAB — URINALYSIS, ROUTINE W REFLEX MICROSCOPIC
Bilirubin Urine: NEGATIVE
Glucose, UA: NEGATIVE mg/dL
Ketones, ur: NEGATIVE mg/dL
Nitrite: NEGATIVE
Protein, ur: NEGATIVE mg/dL
Specific Gravity, Urine: 1.021 (ref 1.005–1.030)
WBC, UA: >50 WBC/hpf — ABNORMAL HIGH (ref 0–5)
pH: 6 (ref 5.0–8.0)

## 2022-02-14 LAB — COMPREHENSIVE METABOLIC PANEL
ALT: 16 U/L (ref 0–44)
AST: 22 U/L (ref 15–41)
Albumin: 3.8 g/dL (ref 3.5–5.0)
Alkaline Phosphatase: 74 U/L (ref 38–126)
Anion gap: 10 (ref 5–15)
BUN: 16 mg/dL (ref 8–23)
CO2: 26 mmol/L (ref 22–32)
Calcium: 9.1 mg/dL (ref 8.9–10.3)
Chloride: 105 mmol/L (ref 98–111)
Creatinine, Ser: 0.76 mg/dL (ref 0.44–1.00)
GFR, Estimated: >60 mL/min (ref >60)
Glucose, Bld: 116 mg/dL — ABNORMAL HIGH (ref 70–99)
Potassium: 3.3 mmol/L — ABNORMAL LOW (ref 3.5–5.1)
Sodium: 141 mmol/L (ref 135–145)
Total Bilirubin: 1 mg/dL (ref 0.3–1.2)
Total Protein: 7.1 g/dL (ref 6.5–8.1)

## 2022-02-14 LAB — SURGICAL PCR SCREEN
MRSA, PCR: NEGATIVE
Staphylococcus aureus: NEGATIVE

## 2022-02-14 NOTE — Patient Instructions (Addendum)
?Your procedure is scheduled on: Friday February 25, 2022. ?Report to Day Surgery inside [facility] 2nd floor, stop by admissions desk before getting on elevator.  ?To find out your arrival time please call [phone] between 1PM - 3PM on Thursday February 24, 2022. ? ?Remember: Instructions that are not followed completely may result in serious medical risk,  ?up to and including death, or upon the discretion of your surgeon and anesthesiologist your  ?surgery may need to be rescheduled.  ? ?  _X__ 1. Do not eat food after midnight the night before your procedure. ?                No chewing gum or hard candies. You may drink clear liquids up to 2 hours ?                before you are scheduled to arrive for your surgery- DO not drink clear ?                liquids within 2 hours of the start of your surgery. ?                Clear Liquids include:  water, apple juice without pulp, clear Gatorade, G2 or  ?                Gatorade Zero (avoid Red/Purple/Blue), Black Coffee or Tea (Do not add ?                anything to coffee or tea). ? ?__X__2.   Complete the "Ensure Clear Pre-surgery Clear Carbohydrate Drink" provided to you, 2 hours before arrival. **If you are diabetic you will be provided with an alternative drink, Gatorade Zero or G2. ? ?__X__3.  On the morning of surgery brush your teeth with toothpaste and water, you ?               may rinse your mouth with mouthwash if you wish.  Do not swallow any toothpaste of mouthwash. ?   ? _X__ 4.  No Alcohol for 24 hours before or after surgery. ? ? _X__ 5.  Do Not Smoke or use e-cigarettes For 24 Hours Prior to Your Surgery. ?                Do not use any chewable tobacco products for at least 6 hours prior to ?                Surgery. ? ?_X__  6.  Do not use any recreational drugs (marijuana, cocaine, heroin, ecstasy, MDMA or other) ?               For at least one week prior to your surgery.  Combination of these drugs with anesthesia ?                May have life threatening results. ? ?____  7.  Bring all medications with you on the day of surgery if instructed.  ? ?__X_ 8.  Notify your doctor if there is any change in your medical condition  ?    (cold, fever, infections). ?    ?Do not wear jewelry, make-up, hairpins, clips or nail polish. ?Do not wear lotions, powders, or perfumes. You may wear deodorant. ?Do not shave 48 hours prior to surgery. Men may shave face and neck. ?Do not bring valuables to the hospital.   ? ?[facility] is not responsible for any belongings or valuables. ? ?  Contacts, dentures or bridgework may not be worn into surgery. ?Leave your suitcase in the car. After surgery it may be brought to your room. ?For patients admitted to the hospital, discharge time is determined by your ?treatment team. ?  ?Patients discharged the day of surgery will not be allowed to drive home.   ?Make arrangements for someone to be with you for the first 24 hours of your ?Same Day Discharge. ? ?  ?Please read over the following fact sheets that you were given:  ? Total Joint Packet  ? ? ?__X__ Take these medicines the morning of surgery with A SIP OF WATER:  ? ? 1. isosorbide mononitrate (IMDUR) 30 MG ? 2. metoprolol succinate (TOPROL-XL) 25 MG ? 3.  ? 4. ? 5. ? 6. ? ?____ Fleet Enema (as directed)  ? ?__X__ Use CHG Soap (or wipes) as directed ? ?____ Use Benzoyl Peroxide Gel as instructed ? ?____ Use inhalers on the day of surgery ? ?____ Stop metformin 2 days prior to surgery   ? ?____ Take 1/2 of usual insulin dose the night before surgery. No insulin the morning ?         of surgery.  ? ?____ Call your PCP, cardiologist, or Pulmonologist if taking Coumadin/Plavix/aspirin and ask when to stop before your surgery.  ? ?__X__ One Week prior to surgery- Stop Anti-inflammatories such as Ibuprofen, Aleve, Advil, Motrin, meloxicam (MOBIC), diclofenac, etodolac, ketorolac, Toradol, Daypro, piroxicam, Goody's or BC powders. OK TO USE TYLENOL IF  NEEDED ?  ?__X__ Stop supplements until after surgery.   ? ?____ Bring C-Pap to the hospital.  ? ? ?If you have any questions regarding your pre-procedure instructions,  ?Please call Pre-admit Testing at [phone] ?

## 2022-02-17 LAB — URINE CULTURE

## 2022-02-20 ENCOUNTER — Encounter: Payer: Self-pay | Admitting: Orthopedic Surgery

## 2022-02-20 NOTE — Progress Notes (Signed)
?Perioperative Services ? ?Pre-Admission/Anesthesia Testing Clinical Review ? ?Date: 02/20/22 ? ?Patient Demographics:  ?Name: Tracy Hogan ?DOB:   Mar 30, 1951 ?MRN:   [id number] ? ?Planned Surgical Procedure(s):  ? ? Case: [id number] Date/Time: 02/25/22 0926  ? Procedure: TOTAL HIP ARTHROPLASTY ANTERIOR APPROACH (Right: Hip)  ? Anesthesia type: Choice  ? Pre-op diagnosis: Primary osteoarthritis of right hip  ? Location: [facility] OR ROOM 01 / [facility]S FOR ANESTHESIA GROUP  ? Surgeons: Hessie Knows, MD  ? ?NOTE: Available PAT nursing documentation and vital signs have been reviewed. Clinical nursing staff has updated patient's PMH/PSHx, current medication list, and drug allergies/intolerances to ensure comprehensive history available to assist in medical decision making as it pertains to the aforementioned surgical procedure and anticipated anesthetic course. Extensive review of available clinical information performed. [place] PMH and PSHx updated with any diagnoses/procedures that  may have been inadvertently omitted during her intake with the pre-admission testing department's nursing staff. ? ?Clinical Discussion:  ?Tracy Hogan is a 71 y.o. female who is submitted for pre-surgical anesthesia review and clearance prior to her undergoing the above procedure. Patient is a Former Smoker (10 pack years; quit 11/1978). Pertinent PMH includes: angina, diastolic dysfunction, cardiac murmur, HTN, HLD, prediabetes, OA. ? ?Patient is followed by cardiology Saralyn Pilar, MD). She was last seen in the cardiology clinic on 12/13/2021; notes reviewed.  At the time of her clinic visit, patient doing well overall from a cardiovascular perspective.  She denied any episodes of chest pain, short of breath, PND, orthopnea, palpitations, significant peripheral edema, vertiginous symptoms, or presyncope/syncope.  Patient with past medical history significant for cardiovascular diagnoses. ? ?TTE performed on 08/31/2021 revealed a low normal  left ventricular systolic function with mild LVH; LVEF 50%.  Diastolic Doppler parameters consistent with pseudonormalization (G2DD).  There was trivial to mild pan valvular regurgitation.  There was no evidence of a significant transvalvular gradient to suggest stenosis. ? ?Myocardial perfusion imaging study performed on 08/31/2021 revealed a normal left ventricular systolic function with an EF 59%.  There was normal myocardial thickening and wall motion.  There was no evidence of stress-induced myocardial ischemia or arrhythmia.  Study determined to be normal and low risk. ? ?Blood pressure well controlled at 128/64 on currently prescribed diuretic, nitrate, ACEi, and beta-blocker therapies.  Patient is on ezetimibe for her HLD diagnosis and ASCVD prevention.  Patient with no further anginal complaints since starting scheduled nitrate therapy.  She is prediabetic; last HgbA1c was obtained in 02/2018 at which time it was 5.6%. Functional capacity, as defined by DASI, is documented as being >/= 4 METS.  No changes were made to her medication regimen.  Patient follow-up with outpatient cardiology in 4 months or sooner if needed. ? ?Tracy Hogan is scheduled for an elective RIGHT TOTAL HIP ARTHROPLASTY on 02/25/2022 with Dr. Hessie Knows, MD.  Given patient's past medical history significant for cardiovascular diagnoses, presurgical cardiac clearance was sought by the PAT team. Per cardiology, "this patient is optimized for surgery and may proceed with the planned procedural course with a LOW risk of significant perioperative cardiovascular complications". In review of her medication reconciliation, it is noted the patient is on daily antiplatelet therapy. She has been instructed on recommendations from her cardiologist for holding her daily low-dose ASA for 5 days prior to her procedure with plans to restart as soon as postoperative bleeding risk felt to be minimized by her primary attending surgeon. The patient is  aware that her last dose of ASA should be on  02/19/2022. ? ?Patient denies previous perioperative complications with anesthesia in the past. In review of the available records, it is noted that patient underwent a general anesthetic course here (ASA III) in 07/2017 without documented complications.  ? ? ?  02/14/2022  ? 10:32 AM 08/14/2017  ?  4:55 PM 08/14/2017  ?  4:45 PM  ?Vitals with BMI  ?Height 5' 4.5"    ?Weight 197 lbs    ?BMI 33.31    ?Systolic [id number]  ?Diastolic 79 77 98  ?Pulse 70 78 82  ? ? ?Providers/Specialists:  ? ?NOTE: Primary physician provider listed below. Patient may have been seen by APP or partner within same practice.  ? ?PROVIDER ROLE / SPECIALTY LAST OV  ?Hessie Knows, MD Orthopedics (Surgeon) 02/07/2022  ?Dion Body, MD Primary Care Provider 01/18/2022  ?Isaias Cowman, MD Cardiology 12/13/2021  ? ?Allergies:  ?Latex and Simvastatin ? ?Current Home Medications:  ? ?No current facility-administered medications for this encounter.  ? ? aspirin EC 81 MG tablet  ? AZO-CRANBERRY PO  ? Cholecalciferol 1000 units tablet  ? ezetimibe (ZETIA) 10 MG tablet  ? hydrochlorothiazide (HYDRODIURIL) 12.5 MG tablet  ? isosorbide mononitrate (IMDUR) 30 MG 24 hr tablet  ? lisinopril (ZESTRIL) 20 MG tablet  ? MAGNESIUM CITRATE PO  ? metoprolol succinate (TOPROL-XL) 25 MG 24 hr tablet  ? Multiple Vitamin (MULTIVITAMIN) capsule  ? loratadine (CLARITIN) 10 MG tablet  ? ?History:  ? ?Past Medical History:  ?Diagnosis Date  ? Anginal pain ([place])   ? Diastolic dysfunction 35/45/6256  ? a.) TTE 08/31/2021: EF 50%; mild LVH, triv TR/MR, mild AR/PR; G2DD.  ? Heart murmur   ? Hypercholesterolemia   ? Hypertension   ? Obesity (BMI 30-39.9)   ? Osteoarthritis   ? Pre-diabetes   ? ?Past Surgical History:  ?Procedure Laterality Date  ? BREAST BIOPSY Left 08/04/2017  ? COMPLEX FIBROADENOMA  ? CATARACT EXTRACTION Bilateral 08/2015  ? COLONOSCOPY N/A 2010  ? COLONOSCOPY WITH PROPOFOL N/A 08/14/2017  ?  Procedure: COLONOSCOPY WITH PROPOFOL;  Surgeon: Manya Silvas, MD;  Location: [facility] ENDOSCOPY;  Service: Endoscopy;  Laterality: N/A;  ? TUBAL LIGATION  1986  ? UTERINE FIBROID SURGERY N/A   ? Benign  ? ?Family History  ?Problem Relation Age of Onset  ? Breast cancer Mother 22  ? ?Social History  ? ?Tobacco Use  ? Smoking status: Former  ?  Packs/day: 1.00  ?  Years: 10.00  ?  Pack years: 10.00  ?  Types: Cigarettes  ?  Quit date: 11/21/1978  ?  Years since quitting: 43.2  ? Smokeless tobacco: Never  ?Vaping Use  ? Vaping Use: Never used  ?Substance Use Topics  ? Alcohol use: Yes  ?  Alcohol/week: 1.0 standard drink  ?  Types: 1 Glasses of wine per week  ?  Comment: rarely  ? Drug use: No  ? ? ?Pertinent Clinical Results:  ?LABS: Labs reviewed: Acceptable for surgery. ? ?No visits with results within 3 Day(s) from this visit.  ?Latest known visit with results is:  ?Hospital Outpatient Visit on 02/14/2022  ?Component Date Value Ref Range Status  ? WBC 02/14/2022 6.0  4.0 - 10.5 K/uL Final  ? RBC 02/14/2022 4.35  3.87 - 5.11 MIL/uL Final  ? Hemoglobin 02/14/2022 12.5  12.0 - 15.0 g/dL Final  ? HCT 02/14/2022 38.5  36.0 - 46.0 % Final  ? MCV 02/14/2022 88.5  80.0 - 100.0 fL Final  ? [place] 02/14/2022 28.7  26.0 - 34.0 pg Final  ? MCHC 02/14/2022 32.5  30.0 - 36.0 g/dL Final  ? RDW 02/14/2022 12.6  11.5 - 15.5 % Final  ? Platelets 02/14/2022 237  150 - 400 K/uL Final  ? nRBC 02/14/2022 0.0  0.0 - 0.2 % Final  ? Neutrophils Relative % 02/14/2022 60  % Final  ? Neutro Abs 02/14/2022 3.6  1.7 - 7.7 K/uL Final  ? Lymphocytes Relative 02/14/2022 29  % Final  ? Lymphs Abs 02/14/2022 1.7  0.7 - 4.0 K/uL Final  ? Monocytes Relative 02/14/2022 8  % Final  ? Monocytes Absolute 02/14/2022 0.5  0.1 - 1.0 K/uL Final  ? Eosinophils Relative 02/14/2022 2  % Final  ? Eosinophils Absolute 02/14/2022 0.1  0.0 - 0.5 K/uL Final  ? Basophils Relative 02/14/2022 1  % Final  ? Basophils Absolute 02/14/2022 0.1  0.0 - 0.1 K/uL Final  ? Immature  Granulocytes 02/14/2022 0  % Final  ? Abs Immature Granulocytes 02/14/2022 0.01  0.00 - 0.07 K/uL Final  ? Performed at [facility], [address], [place], [place] [address]  ? Sodium 02/14/2022 14

## 2022-02-25 ENCOUNTER — Ambulatory Visit: Payer: Medicare HMO | Admitting: Urgent Care

## 2022-02-25 ENCOUNTER — Ambulatory Visit: Payer: Medicare HMO

## 2022-02-25 ENCOUNTER — Encounter: Payer: Self-pay | Admitting: Orthopedic Surgery

## 2022-02-25 ENCOUNTER — Other Ambulatory Visit: Payer: Self-pay

## 2022-02-25 ENCOUNTER — Encounter
Admission: RE | Disposition: A | Discharge status: Home Health | Payer: Self-pay | Source: Home / Self Care | Attending: Orthopedic Surgery

## 2022-02-25 ENCOUNTER — Observation Stay
Admission: RE | Admit: 2022-02-25 | Discharge: 2022-02-26 | Disposition: A | Discharge status: Home Health | Payer: Medicare HMO | Attending: Orthopedic Surgery | Admitting: Orthopedic Surgery

## 2022-02-25 DIAGNOSIS — Z7982 Long term (current) use of aspirin: Secondary | ICD-10-CM | POA: Diagnosis not present

## 2022-02-25 DIAGNOSIS — Z79899 Other long term (current) drug therapy: Secondary | ICD-10-CM | POA: Diagnosis not present

## 2022-02-25 DIAGNOSIS — Z96641 Presence of right artificial hip joint: Secondary | ICD-10-CM

## 2022-02-25 DIAGNOSIS — E7800 Pure hypercholesterolemia, unspecified: Secondary | ICD-10-CM | POA: Diagnosis not present

## 2022-02-25 DIAGNOSIS — I10 Essential (primary) hypertension: Secondary | ICD-10-CM | POA: Diagnosis not present

## 2022-02-25 DIAGNOSIS — R7303 Prediabetes: Secondary | ICD-10-CM | POA: Diagnosis not present

## 2022-02-25 DIAGNOSIS — M1611 Unilateral primary osteoarthritis, right hip: Principal | ICD-10-CM | POA: Insufficient documentation

## 2022-02-25 DIAGNOSIS — Z471 Aftercare following joint replacement surgery: Secondary | ICD-10-CM | POA: Diagnosis not present

## 2022-02-25 HISTORY — DX: Angina pectoris, unspecified: I20.9

## 2022-02-25 HISTORY — DX: Unspecified osteoarthritis, unspecified site: M19.90

## 2022-02-25 HISTORY — PX: TOTAL HIP ARTHROPLASTY: SHX124

## 2022-02-25 LAB — CBC
HCT: 33 % — ABNORMAL LOW (ref 36.0–46.0)
Hemoglobin: 10.6 g/dL — ABNORMAL LOW (ref 12.0–15.0)
MCH: 28.7 pg (ref 26.0–34.0)
MCHC: 32.1 g/dL (ref 30.0–36.0)
MCV: 89.4 fL (ref 80.0–100.0)
Platelets: 231 10*3/uL (ref 150–400)
RBC: 3.69 MIL/uL — ABNORMAL LOW (ref 3.87–5.11)
RDW: 12.7 % (ref 11.5–15.5)
WBC: 12.3 10*3/uL — ABNORMAL HIGH (ref 4.0–10.5)
nRBC: 0 % (ref 0.0–0.2)

## 2022-02-25 LAB — ABO/RH: ABO/RH(D): A POS

## 2022-02-25 LAB — CREATININE, SERUM
Creatinine, Ser: 0.63 mg/dL (ref 0.44–1.00)
GFR, Estimated: >60 mL/min (ref >60)

## 2022-02-25 SURGERY — ARTHROPLASTY, HIP, TOTAL, ANTERIOR APPROACH
Anesthesia: Spinal | Site: Hip | Laterality: Right

## 2022-02-25 MED ORDER — SENNOSIDES-DOCUSATE SODIUM 8.6-50 MG PO TABS: 1.0000 | ORAL_TABLET | Freq: Every evening | ORAL | Status: DC | PRN

## 2022-02-25 MED ORDER — FLEET ENEMA 7-19 GM/118ML RE ENEM: 1.0000 | ENEMA | Freq: Once | RECTAL | Status: DC | PRN

## 2022-02-25 MED ORDER — MIDAZOLAM HCL 5 MG/5ML IJ SOLN
INTRAMUSCULAR | Status: DC | PRN
  Administered 2022-02-25 (×2): 1 mg via INTRAVENOUS

## 2022-02-25 MED ORDER — METOCLOPRAMIDE HCL 5 MG/ML IJ SOLN: 5.0000 mg | Freq: Three times a day (TID) | INTRAMUSCULAR | Status: DC | PRN

## 2022-02-25 MED ORDER — PANTOPRAZOLE SODIUM 40 MG PO TBEC
40.0000 mg | DELAYED_RELEASE_TABLET | Freq: Every day | ORAL | Status: DC
  Administered 2022-02-25 – 2022-02-26 (×2): 40 mg via ORAL
  Filled 2022-02-25 (×2): qty 1

## 2022-02-25 MED ORDER — VITAMIN D 25 MCG (1000 UNIT) PO TABS
1000.0000 [IU] | ORAL_TABLET | Freq: Every day | ORAL | Status: DC
  Administered 2022-02-25 – 2022-02-26 (×2): 1000 [IU] via ORAL
  Filled 2022-02-25 (×2): qty 1

## 2022-02-25 MED ORDER — CEFAZOLIN SODIUM-DEXTROSE 2-4 GM/100ML-% IV SOLN
2.0000 g | Freq: Four times a day (QID) | INTRAVENOUS | Status: AC
  Administered 2022-02-25: 2 g via INTRAVENOUS
  Filled 2022-02-25 (×2): qty 100

## 2022-02-25 MED ORDER — METOPROLOL SUCCINATE ER 25 MG PO TB24
25.0000 mg | ORAL_TABLET | Freq: Every day | ORAL | Status: DC
Start: 2022-02-26 — End: 2022-02-26
  Administered 2022-02-26: 25 mg via ORAL
  Filled 2022-02-25: qty 1

## 2022-02-25 MED ORDER — LORATADINE 10 MG PO TABS
10.0000 mg | ORAL_TABLET | Freq: Every day | ORAL | Status: DC
  Administered 2022-02-25 – 2022-02-26 (×2): 10 mg via ORAL
  Filled 2022-02-25 (×2): qty 1

## 2022-02-25 MED ORDER — FENTANYL CITRATE (PF) 100 MCG/2ML IJ SOLN
INTRAMUSCULAR | Status: DC | PRN
  Administered 2022-02-25: 50 ug via INTRAVENOUS
  Administered 2022-02-25: 25 ug via INTRAVENOUS

## 2022-02-25 MED ORDER — ACETAMINOPHEN 10 MG/ML IV SOLN
INTRAVENOUS | Status: AC
  Filled 2022-02-25: qty 100

## 2022-02-25 MED ORDER — ENOXAPARIN SODIUM 40 MG/0.4ML IJ SOSY
40.0000 mg | PREFILLED_SYRINGE | INTRAMUSCULAR | Status: DC
  Administered 2022-02-26: 40 mg via SUBCUTANEOUS
  Filled 2022-02-25: qty 0.4

## 2022-02-25 MED ORDER — ORAL CARE MOUTH RINSE: 15.0000 mL | Freq: Once | OROMUCOSAL | Status: AC

## 2022-02-25 MED ORDER — FENTANYL CITRATE (PF) 100 MCG/2ML IJ SOLN: 25.0000 ug | INTRAMUSCULAR | Status: DC | PRN

## 2022-02-25 MED ORDER — TRAMADOL HCL 50 MG PO TABS
50.0000 mg | ORAL_TABLET | Freq: Four times a day (QID) | ORAL | Status: DC
  Administered 2022-02-25 – 2022-02-26 (×3): 50 mg via ORAL
  Filled 2022-02-25 (×3): qty 1

## 2022-02-25 MED ORDER — DIPHENHYDRAMINE HCL 12.5 MG/5ML PO ELIX: 12.5000 mg | ORAL_SOLUTION | ORAL | Status: DC | PRN

## 2022-02-25 MED ORDER — FAMOTIDINE 20 MG PO TABS
ORAL_TABLET | ORAL | Status: AC
  Administered 2022-02-25: 20 mg via ORAL
  Filled 2022-02-25: qty 1

## 2022-02-25 MED ORDER — ONDANSETRON HCL 4 MG PO TABS
4.0000 mg | ORAL_TABLET | Freq: Four times a day (QID) | ORAL | Status: DC | PRN
  Administered 2022-02-25: 4 mg via ORAL
  Filled 2022-02-25: qty 1

## 2022-02-25 MED ORDER — PROPOFOL 10 MG/ML IV BOLUS
INTRAVENOUS | Status: DC | PRN
  Administered 2022-02-25: 20 mg via INTRAVENOUS

## 2022-02-25 MED ORDER — PHENYLEPHRINE HCL-NACL 20-0.9 MG/250ML-% IV SOLN
INTRAVENOUS | Status: DC | PRN
  Administered 2022-02-25: 40 ug/min via INTRAVENOUS

## 2022-02-25 MED ORDER — HYDROCHLOROTHIAZIDE 12.5 MG PO TABS
12.5000 mg | ORAL_TABLET | Freq: Every day | ORAL | Status: DC
  Administered 2022-02-25 – 2022-02-26 (×2): 12.5 mg via ORAL
  Filled 2022-02-25 (×2): qty 1

## 2022-02-25 MED ORDER — SODIUM CHLORIDE (PF) 0.9 % IJ SOLN
INTRAMUSCULAR | Status: DC | PRN
  Administered 2022-02-25: 90 mL via INTRAMUSCULAR

## 2022-02-25 MED ORDER — DOCUSATE SODIUM 100 MG PO CAPS
100.0000 mg | ORAL_CAPSULE | Freq: Two times a day (BID) | ORAL | Status: DC
  Administered 2022-02-25 – 2022-02-26 (×3): 100 mg via ORAL
  Filled 2022-02-25 (×3): qty 1

## 2022-02-25 MED ORDER — EZETIMIBE 10 MG PO TABS
10.0000 mg | ORAL_TABLET | Freq: Every day | ORAL | Status: DC
  Filled 2022-02-25 (×2): qty 1

## 2022-02-25 MED ORDER — BISACODYL 5 MG PO TBEC: 5.0000 mg | DELAYED_RELEASE_TABLET | Freq: Every day | ORAL | Status: DC | PRN

## 2022-02-25 MED ORDER — METHOCARBAMOL 500 MG PO TABS
500.0000 mg | ORAL_TABLET | Freq: Four times a day (QID) | ORAL | Status: DC | PRN
  Administered 2022-02-25: 500 mg via ORAL
  Filled 2022-02-25: qty 1

## 2022-02-25 MED ORDER — ADULT MULTIVITAMIN W/MINERALS CH
1.0000 | ORAL_TABLET | Freq: Every day | ORAL | Status: DC
  Administered 2022-02-25 – 2022-02-26 (×2): 1 via ORAL
  Filled 2022-02-25 (×2): qty 1

## 2022-02-25 MED ORDER — ASPIRIN EC 81 MG PO TBEC
81.0000 mg | DELAYED_RELEASE_TABLET | Freq: Every day | ORAL | Status: DC
  Administered 2022-02-25 – 2022-02-26 (×2): 81 mg via ORAL
  Filled 2022-02-25 (×2): qty 1

## 2022-02-25 MED ORDER — LACTATED RINGERS IV SOLN: INTRAVENOUS | Status: DC

## 2022-02-25 MED ORDER — FENTANYL CITRATE (PF) 100 MCG/2ML IJ SOLN
INTRAMUSCULAR | Status: AC
  Filled 2022-02-25: qty 2

## 2022-02-25 MED ORDER — ALUM & MAG HYDROXIDE-SIMETH 200-200-20 MG/5ML PO SUSP: 30.0000 mL | ORAL | Status: DC | PRN

## 2022-02-25 MED ORDER — MIDAZOLAM HCL 2 MG/2ML IJ SOLN
INTRAMUSCULAR | Status: AC
  Filled 2022-02-25: qty 2

## 2022-02-25 MED ORDER — CEFAZOLIN SODIUM-DEXTROSE 2-4 GM/100ML-% IV SOLN
2.0000 g | INTRAVENOUS | Status: AC
  Administered 2022-02-25: 2 g via INTRAVENOUS

## 2022-02-25 MED ORDER — PROPOFOL 1000 MG/100ML IV EMUL
INTRAVENOUS | Status: AC
  Filled 2022-02-25: qty 100

## 2022-02-25 MED ORDER — HEMOSTATIC AGENTS (NO CHARGE) OPTIME
TOPICAL | Status: DC | PRN
  Administered 2022-02-25: 2 via TOPICAL

## 2022-02-25 MED ORDER — CHLORHEXIDINE GLUCONATE 0.12 % MT SOLN
OROMUCOSAL | Status: AC
  Administered 2022-02-25: 15 mL via OROMUCOSAL
  Filled 2022-02-25: qty 15

## 2022-02-25 MED ORDER — BUPIVACAINE LIPOSOME 1.3 % IJ SUSP
INTRAMUSCULAR | Status: AC
  Filled 2022-02-25: qty 20

## 2022-02-25 MED ORDER — ZOLPIDEM TARTRATE 5 MG PO TABS: 5.0000 mg | ORAL_TABLET | Freq: Every evening | ORAL | Status: DC | PRN

## 2022-02-25 MED ORDER — BUPIVACAINE HCL (PF) 0.5 % IJ SOLN
INTRAMUSCULAR | Status: DC | PRN
  Administered 2022-02-25: 2.3 mL

## 2022-02-25 MED ORDER — PHENOL 1.4 % MT LIQD: 1.0000 | OROMUCOSAL | Status: DC | PRN

## 2022-02-25 MED ORDER — HYDROCODONE-ACETAMINOPHEN 7.5-325 MG PO TABS
1.0000 | ORAL_TABLET | ORAL | Status: DC | PRN
  Administered 2022-02-25: 2 via ORAL
  Administered 2022-02-25: 1 via ORAL
  Filled 2022-02-25: qty 2

## 2022-02-25 MED ORDER — CHLORHEXIDINE GLUCONATE 0.12 % MT SOLN: 15.0000 mL | Freq: Once | OROMUCOSAL | Status: AC

## 2022-02-25 MED ORDER — HYDROCODONE-ACETAMINOPHEN 5-325 MG PO TABS
1.0000 | ORAL_TABLET | ORAL | Status: DC | PRN
  Administered 2022-02-26: 1 via ORAL
  Filled 2022-02-25: qty 1

## 2022-02-25 MED ORDER — ONDANSETRON HCL 4 MG/2ML IJ SOLN: 4.0000 mg | Freq: Four times a day (QID) | INTRAMUSCULAR | Status: DC | PRN

## 2022-02-25 MED ORDER — MENTHOL 3 MG MT LOZG: 1.0000 | LOZENGE | OROMUCOSAL | Status: DC | PRN

## 2022-02-25 MED ORDER — FAMOTIDINE 20 MG PO TABS: 20.0000 mg | ORAL_TABLET | Freq: Once | ORAL | Status: AC

## 2022-02-25 MED ORDER — METOCLOPRAMIDE HCL 10 MG PO TABS: 5.0000 mg | ORAL_TABLET | Freq: Three times a day (TID) | ORAL | Status: DC | PRN

## 2022-02-25 MED ORDER — PROPOFOL 500 MG/50ML IV EMUL
INTRAVENOUS | Status: DC | PRN
  Administered 2022-02-25: 100 ug/kg/min via INTRAVENOUS

## 2022-02-25 MED ORDER — CEFAZOLIN SODIUM-DEXTROSE 2-4 GM/100ML-% IV SOLN
INTRAVENOUS | Status: AC
  Administered 2022-02-25: 2 g via INTRAVENOUS
  Filled 2022-02-25: qty 100

## 2022-02-25 MED ORDER — ACETAMINOPHEN 325 MG PO TABS: 325.0000 mg | ORAL_TABLET | Freq: Four times a day (QID) | ORAL | Status: DC | PRN

## 2022-02-25 MED ORDER — LISINOPRIL 20 MG PO TABS
20.0000 mg | ORAL_TABLET | Freq: Every day | ORAL | Status: DC
  Administered 2022-02-25 – 2022-02-26 (×2): 20 mg via ORAL
  Filled 2022-02-25 (×2): qty 1

## 2022-02-25 MED ORDER — SODIUM CHLORIDE 0.9 % IV SOLN: INTRAVENOUS | Status: DC

## 2022-02-25 MED ORDER — METHOCARBAMOL 1000 MG/10ML IJ SOLN
500.0000 mg | Freq: Four times a day (QID) | INTRAVENOUS | Status: DC | PRN
  Filled 2022-02-25: qty 5

## 2022-02-25 MED ORDER — ISOSORBIDE MONONITRATE ER 30 MG PO TB24
30.0000 mg | ORAL_TABLET | Freq: Every day | ORAL | Status: DC
  Administered 2022-02-26: 30 mg via ORAL
  Filled 2022-02-25: qty 1

## 2022-02-25 MED ORDER — MORPHINE SULFATE (PF) 2 MG/ML IV SOLN: 0.5000 mg | INTRAVENOUS | Status: DC | PRN

## 2022-02-25 MED ORDER — SODIUM CHLORIDE 0.9 % IR SOLN: Status: DC | PRN

## 2022-02-25 SURGICAL SUPPLY — 55 items
BLADE SAGITTAL AGGR TOOTH XLG (BLADE) ×2 IMPLANT
BNDG COHESIVE 6X5 TAN ST LF (GAUZE/BANDAGES/DRESSINGS) ×4 IMPLANT
CANISTER WOUND CARE 500ML ATS (WOUND CARE) ×2 IMPLANT
CHLORAPREP W/TINT 26 (MISCELLANEOUS) ×2 IMPLANT
COVER BACK TABLE REUSABLE LG (DRAPES) ×2 IMPLANT
CUP ACETAB VERSA DBL 28X58 DMI (Orthopedic Implant) ×1 IMPLANT
DRAPE 3/4 80X56 (DRAPES) ×5 IMPLANT
DRAPE C-ARM XRAY 36X54 (DRAPES) ×2 IMPLANT
DRAPE INCISE IOBAN 66X60 STRL (DRAPES) IMPLANT
DRAPE POUCH INSTRU U-SHP 10X18 (DRAPES) ×2 IMPLANT
DRESSING SURGICEL FIBRLLR 1X2 (HEMOSTASIS) ×2 IMPLANT
DRSG MEPILEX SACRM 8.7X9.8 (GAUZE/BANDAGES/DRESSINGS) ×2 IMPLANT
DRSG SURGICEL FIBRILLAR 1X2 (HEMOSTASIS) ×4
ELECT BLADE 6.5 EXT (BLADE) ×2 IMPLANT
ELECT REM PT RETURN 9FT ADLT (ELECTROSURGICAL) ×2
ELECTRODE REM PT RTRN 9FT ADLT (ELECTROSURGICAL) ×1 IMPLANT
GLOVE SURG SYN 9.0  PF PI (GLOVE) ×2
GLOVE SURG SYN 9.0 PF PI (GLOVE) ×2 IMPLANT
GLOVE SURG UNDER POLY LF SZ9 (GLOVE) ×2 IMPLANT
GOWN SRG 2XL LVL 4 RGLN SLV (GOWNS) ×1 IMPLANT
GOWN STRL NON-REIN 2XL LVL4 (GOWNS) ×2
GOWN STRL REUS W/ TWL LRG LVL3 (GOWN DISPOSABLE) ×1 IMPLANT
GOWN STRL REUS W/TWL LRG LVL3 (GOWN DISPOSABLE) ×1
HIP FEM HD M 28 (Head) ×1 IMPLANT
HOLDER FOLEY CATH W/STRAP (MISCELLANEOUS) ×2 IMPLANT
HOOD PEEL AWAY FLYTE STAYCOOL (MISCELLANEOUS) ×3 IMPLANT
KIT PREVENA INCISION MGT 13 (CANNISTER) ×2 IMPLANT
MANIFOLD NEPTUNE II (INSTRUMENTS) ×2 IMPLANT
MAT ABSORB  FLUID 56X50 GRAY (MISCELLANEOUS) ×1
MAT ABSORB FLUID 56X50 GRAY (MISCELLANEOUS) ×1 IMPLANT
NDL SPNL 20GX3.5 QUINCKE YW (NEEDLE) ×2 IMPLANT
NEEDLE SPNL 20GX3.5 QUINCKE YW (NEEDLE) ×4 IMPLANT
NS IRRIG 1000ML POUR BTL (IV SOLUTION) ×2 IMPLANT
PACK HIP COMPR (MISCELLANEOUS) ×2 IMPLANT
SCALPEL PROTECTED #10 DISP (BLADE) ×4 IMPLANT
SHELL ACETABULAR SZ0 58MM (Shell) ×1 IMPLANT
STAPLER SKIN PROX 35W (STAPLE) ×2 IMPLANT
STEM FEMORAL SZ3  STD COLLARED (Stem) ×1 IMPLANT
STRAP SAFETY 5IN WIDE (MISCELLANEOUS) ×2 IMPLANT
SUT DVC 2 QUILL PDO  T11 36X36 (SUTURE) ×1
SUT DVC 2 QUILL PDO T11 36X36 (SUTURE) ×1 IMPLANT
SUT SILK 0 (SUTURE) ×1
SUT SILK 0 30XBRD TIE 6 (SUTURE) ×1 IMPLANT
SUT V-LOC 90 ABS 3-0 VLT  V-20 (SUTURE) ×1
SUT V-LOC 90 ABS 3-0 VLT V-20 (SUTURE) IMPLANT
SUT V-LOC 90 ABS DVC 3-0 CL (SUTURE) ×1 IMPLANT
SUT VIC AB 1 CT1 36 (SUTURE) ×2 IMPLANT
SYR 20ML LL LF (SYRINGE) ×2 IMPLANT
SYR 30ML LL (SYRINGE) ×2 IMPLANT
SYR 50ML LL SCALE MARK (SYRINGE) ×4 IMPLANT
SYR BULB IRRIG 60ML STRL (SYRINGE) ×2 IMPLANT
TAPE MICROFOAM 4IN (TAPE) ×2 IMPLANT
TOWEL OR 17X26 4PK STRL BLUE (TOWEL DISPOSABLE) IMPLANT
TRAY FOLEY MTR SLVR 16FR STAT (SET/KITS/TRAYS/PACK) ×2 IMPLANT
WATER STERILE IRR 1000ML POUR (IV SOLUTION) ×1 IMPLANT

## 2022-02-25 NOTE — Op Note (Signed)
02/25/2022 ? ?11:25 AM ? ?PATIENT:  Tracy Hogan  71 y.o. female ? ?PRE-OPERATIVE DIAGNOSIS:  Primary osteoarthritis of right hip ? ?POST-OPERATIVE DIAGNOSIS:  Primary osteoarthritis of right hip ? ?PROCEDURE:  Procedure(s): ?TOTAL HIP ARTHROPLASTY ANTERIOR APPROACH (Right) ? ?SURGEON: Laurene Footman, MD ? ?ASSISTANTS: none ? ?ANESTHESIA:   spinal ? ?EBL:  No intake/output data recorded. ? ?BLOOD ADMINISTERED:none ? ?DRAINS:  Incisional wound VAC   ? ?LOCAL MEDICATIONS USED:  MARCAINE    and OTHER Exparel ? ?SPECIMEN:  Source of Specimen:    Right femoral head ? ?DISPOSITION OF SPECIMEN:  PATHOLOGY ? ?COUNTS:  YES ? ?TOURNIQUET:  * No tourniquets in log * ? ?IMPLANTS: Medacta AMIS 3 standard stem with metal M 28 mm head, 58 mm mpact DM cup and liner ? ?DICTATION: .Dragon Dictation   The patient was brought to the operating room and after spinal anesthesia was obtained patient was placed on the operative table with the ipsilateral foot into the Medacta attachment, contralateral leg on a well-padded table. C-arm was brought in and preop template x-ray taken. After prepping and draping in usual sterile fashion appropriate patient identification and timeout procedures were completed. Anterior approach to the hip was obtained and centered over the greater trochanter and TFL muscle. The subcutaneous tissue was incised hemostasis being achieved by electrocautery. TFL fascia was incised and the muscle retracted laterally deep retractor placed. The lateral femoral circumflex vessels were identified and ligated. The anterior capsule was exposed and a capsulotomy performed. The neck was identified and a femoral neck cut carried out with a saw. The head was removed without difficulty and showed sclerotic femoral head and acetabulum. Reaming was carried out to 58 mm and a 58 mm cup trial gave appropriate tightness to the acetabular component a 58 DM cup was impacted into position. The leg was then externally rotated and  ischiofemoral and pubofemoral releases carried out. The femur was sequentially broached to a size 3, size 3 standard with S head trials were placed and the final components chosen. The 3 standard stem was inserted along with a metal M 28 mm head and 58 mm liner. The hip was reduced and was stable the wound was thoroughly irrigated with fibrillar placed along the posterior capsule and medial neck. The deep fascia ws closed using a heavy Quill after infiltration of 30 cc of quarter percent Sensorcaine with epinephrine diluted with Exparel throughout the case .3-0 V-loc to close the skin with skin staples.  Incisional VAC applied and patient was sent to recovery in stable condition.  ? ?PLAN OF CARE: Admit for overnight observation ? ?

## 2022-02-25 NOTE — Anesthesia Procedure Notes (Signed)
Spinal ? ?Patient location during procedure: OR ?Start time: 02/25/2022 9:38 AM ?End time: 02/25/2022 9:45 AM ?Reason for block: surgical anesthesia ?Staffing ?Performed: resident/CRNA  ?Anesthesiologist: Martha Clan, MD ?Resident/CRNA: Debe Coder, CRNA ?Preanesthetic Checklist ?Completed: patient identified, IV checked, site marked, risks and benefits discussed, surgical consent, monitors and equipment checked, pre-op evaluation and timeout performed ?Spinal Block ?Patient position: sitting ?Prep: DuraPrep ?Patient monitoring: heart rate, cardiac monitor, continuous pulse ox and blood pressure ?Approach: midline ?Location: L3-4 ?Injection technique: single-shot ?Needle ?Needle type: Sprotte  ?Needle gauge: 24 G ?Needle length: 9 cm ?Assessment ?Sensory level: T4 ?Events: CSF return ? ? ? ?

## 2022-02-25 NOTE — H&P (Signed)
?Chief Complaint  ?Patient presents with  ? Right Hip - Pain  ? ? ?History of the Present Illness: ?Tracy Hogan is a 71 y.o. female here today.  ? ?The patient presents for discussion of possible right total hip arthroplasty. She had an x-ray with Dr. Netty Starring on 11/30/2021 that shows complete loss of joint space. There is extensive subchondral cyst throughout the superior acetabulum, approximately 3 cm area of large cyst. The head has extensive cysts and large osteophytes. The left hip also has severe central arthritis, but without the extensive cyst formation. ? ?The patient states her right hip is not doing well. She has pain when she is sitting. The pain does not wake her up when she is lying in bed unless she moves. She thought she had a pinched nerve in her right hip 15 to 20 years ago, and she was put in physical therapy. She thought that was the issue, but pinching and shooting pain persisted. She can walk with a cane at home. She can get to a grocery store, but she cannot do much else. She has 2 steps up from the driveway and 3 steps into the house from the backdoor. She has not had an injection in her right hip. ? 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 She has never had a history of blood clots. She does not smoke. She is allergic to SIMVASTATIN, which causes muscle aches. She has urinary frequency. She was put on simvastatin originally when she was put on medication at [facility] for her blood pressure, but her cholesterol was fine. ? ?The patient lives in [place], [place]. She has a brother living with her, but she does not need a lot of help. ? ?I have reviewed past medical, surgical, social and family history, and allergies as documented in the EMR. ? ?Past Medical History: ?Past Medical History:  ?Diagnosis Date  ? Hypertension  ? Pure hypercholesterolemia  ? ?Past Surgical History: ?Past Surgical History:  ?Procedure Laterality Date  ? COLONOSCOPY 08/14/2017  ?Adenomatous  Polyp, [facility]CC (Father), FH Colon Polyps (Father): [facility] 07/2022  ? Benign uterine fibroid tumor removal  ? CATARACT EXTRACTION Bilateral  ?08/2015 & 10/2015  ? COLONOSCOPY ~ 2010  ?No polyps per pt (Unknown MD/Facility)  ? COLONOSCOPY  ?[facility] (Father), FH Colon Polyps (Father)  ? ?Past Family History: ?Family History  ?Problem Relation Age of Onset  ? High blood pressure (Hypertension) Mother  ? Breast cancer Mother  ? Myocardial Infarction (Heart attack) Father  ? Colon cancer Father  ? Colon polyps Father  ? No Known Problems Sister  ? No Known Problems Brother  ? No Known Problems Brother  ? ?Medications: ?Current Outpatient Medications Ordered in Epic  ?Medication Sig Dispense Refill  ? aspirin 81 MG EC tablet Take 1 tablet (81 mg total) by mouth once daily 30 tablet 11  ? cholecalciferol (VITAMIN D3) 1000 unit tablet Take 1,000 Units by mouth once daily.  ? cranberry fruit concentrate (AZO CRANBERRY ORAL) Take by mouth once daily  ? ezetimibe (ZETIA) 10 mg tablet TAKE 1 TABLET(10 MG) BY MOUTH EVERY DAY 90 tablet 1  ? hydroCHLOROthiazide (HYDRODIURIL) 12.5 MG tablet Take 1 tablet (12.5 mg total) by mouth once daily 90 tablet 1  ? isosorbide mononitrate (IMDUR) 30 MG ER tablet Take 1 tablet (30 mg total) by mouth once daily 30 tablet 11  ? lisinopriL (ZESTRIL) 20 MG tablet TAKE 1 TABLET(20 MG) BY MOUTH EVERY DAY 90 tablet 1  ?  loratadine (CLARITIN) 10 mg tablet Take 10 mg by mouth once daily as needed for Allergies.  ? magnesium 200 mg Take by mouth once daily  ? melatonin 10 mg Tab Take by mouth Unsure of MG's, takes nightly PRN  ? metoprolol succinate (TOPROL-XL) 25 MG XL tablet Take 1 tablet (25 mg total) by mouth once daily 30 tablet 11  ? multivitamin tablet Take 1 tablet by mouth once daily.  ? nitroGLYcerin (NITROSTAT) 0.4 MG SL tablet Place 1 tablet (0.4 mg total) under the tongue every 5 (five) minutes as needed for Chest pain May take up to 3 doses. 30 tablet 0  ? ?No current Epic-ordered  facility-administered medications on file.  ? ?Allergies: ?Allergies  ?Allergen Reactions  ? Simvastatin Other (See Comments)  ?Frequent urination  ? ? ?Body mass index is 33.12 kg/m?. ? ?Review of Systems: ?A comprehensive 14 point ROS was performed, reviewed, and the pertinent orthopaedic findings are documented in the HPI. ? ?Vitals:  ?02/07/22 0945  ?BP: 122/76  ? ? ?General Physical Examination:  ? ?General/Constitutional: No apparent distress: well-nourished and well developed. ?Eyes: Pupils equal, round with synchronous movement. ?Lungs: Clear to auscultation ?HEENT: Normal ?Vascular: No edema, swelling or tenderness, except as noted in detailed exam. ?Cardiac: Heart rate and rhythm is regular. ?Integumentary: No impressive skin lesions present, except as noted in detailed exam. ?Neuro/Psych: Normal mood and affect, oriented to person, place and time. ? ?On exam, very antalgic gait. Weakness with right hip flexion. Right hip has 0 degrees internal rotation and 20 degrees external with slight flexion contracture. ? ?Radiographs: ? ?No new imaging studies were obtained today. ? ?Assessment: ?ICD-10-CM  ?1. Primary osteoarthritis of right hip M16.11  ? ?Plan: ? ?The patient has clinical findings of severe right hip osteoarthritis. ? ?We discussed the patient's prior x-ray findings. I explained her left hip has significant arthritis, but her right hip has severe osteoarthritis with large cysts in the bone. I recommend right total hip arthroplasty. I explained the surgery and postoperative course in detail. I explained she will need to be on a walker for 2 weeks and a cane for 2 weeks. ? ?We will schedule the patient for right total hip arthroplasty in the near future. ? ?Surgical Risks: ? ?The nature of the condition and the proposed procedure has been reviewed in detail with the patient. Surgical versus non-surgical options and prognosis for recovery have been reviewed and the inherent risks and benefits of each  have been discussed including the risks of infection, bleeding, injury to nerves/blood vessels/tendons, incomplete relief of symptoms, persisting pain and/or stiffness, loss of function, complex regional pain syndrome, failure of the procedure, as appropriate. ? ?Document Attestation: ?I, Sowjanya Panditi, have reviewed and updated documentation for [facility], MD, utilizing [employer] DAX.  ? ?Electronically signed by Lauris Poag, MD at 02/07/2022 8:08 PM EDT ? ?Reviewed  H+P. ?No changes noted. ? ?

## 2022-02-25 NOTE — Anesthesia Preprocedure Evaluation (Signed)
Anesthesia Evaluation  ?Patient identified by MRN, date of birth, ID band ?Patient awake ? ? ? ?Reviewed: ?Allergy & Precautions, H&P , NPO status , Patient's Chart, lab work & pertinent test results ? ?History of Anesthesia Complications ?Negative for: history of anesthetic complications ? ?Airway ?Mallampati: III ? ?TM Distance: <3 FB ?Neck ROM: limited ? ? ? Dental ? ?(+) Chipped, Dental Advidsory Given ?  ?Pulmonary ?neg shortness of breath, neg COPD, neg recent URI, former smoker,  ?  ? ? ? ? ? ? ? Cardiovascular ?Exercise Tolerance: Good ?hypertension, + angina (stable) (-) CAD, (-) Past MI and (-) Cardiac Stents (-) dysrhythmias + Valvular Problems/Murmurs  ? ? ?  ?Neuro/Psych ?negative neurological ROS ? negative psych ROS  ? GI/Hepatic ?negative GI ROS, Neg liver ROS, neg GERD  ,  ?Endo/Other  ?negative endocrine ROS ? Renal/GU ?negative Renal ROS  ?negative genitourinary ?  ?Musculoskeletal ? ? Abdominal ?  ?Peds ? Hematology ?negative hematology ROS ?(+)   ?Anesthesia Other Findings ?Past Medical History: ?No date: Hypercholesterolemia ?No date: Hypertension ?No date: Obesity (BMI 30-39.9) ?No date: Pre-diabetes ? ?Past Surgical History: ?08/04/2017: BREAST BIOPSY; Left ?    Comment:  left [place] bx path pending ?No date: EYE SURGERY ?No date: TUBAL LIGATION ?No date: uterine fibroid tumor extraction ? ?BMI   ? Body Mass Index:  29.45 kg/m?  ?  ? ? Reproductive/Obstetrics ?negative OB ROS ? ?  ? ? ? ? ? ? ? ? ? ? ? ? ? ?  ?  ? ? ? ? ? ? ? ? ?Anesthesia Physical ? ?Anesthesia Plan ? ?ASA: III ? ?Anesthesia Plan: Spinal  ? ?Post-op Pain Management:   ? ?Induction: Intravenous ? ?PONV Risk Score and Plan: 2 and Propofol infusion and TIVA ? ?Airway Management Planned: Natural Airway and Nasal Cannula ? ?Additional Equipment:  ? ?Intra-op Plan:  ? ?Post-operative Plan:  ? ?Informed Consent: I have reviewed the patients History and Physical, chart, labs and discussed the procedure  including the risks, benefits and alternatives for the proposed anesthesia with the patient or authorized representative who has indicated his/her understanding and acceptance.  ? ? ? ?Dental Advisory Given ? ?Plan Discussed with: Anesthesiologist, CRNA and Surgeon ? ?Anesthesia Plan Comments: (Patient consented for risks of anesthesia including but not limited to:  ?- adverse reactions to medications ?- risk of intubation if required ?- damage to teeth, lips or other oral mucosa ?- sore throat or hoarseness ?- Damage to heart, brain, lungs or loss of life ? ?Patient voiced understanding.)  ? ? ? ? ? ? ?Anesthesia Quick Evaluation ? ?

## 2022-02-25 NOTE — Transfer of Care (Signed)
Immediate Anesthesia Transfer of Care Note ? ?Patient: Tracy Hogan ? ?Procedure(s) Performed: TOTAL HIP ARTHROPLASTY ANTERIOR APPROACH (Right: Hip) ? ?Patient Location: PACU ? ?Anesthesia Type:Spinal ? ?Level of Consciousness: awake, alert  and oriented ? ?Airway & Oxygen Therapy: Patient Spontanous Breathing ? ?Post-op Assessment: Report given to RN and Post -op Vital signs reviewed and stable ? ?Post vital signs: Reviewed and stable ? ?Last Vitals:  ?Vitals Value Taken Time  ?BP 107/68 02/25/22 1132  ?Temp    ?Pulse 77 02/25/22 1135  ?Resp 16 02/25/22 1135  ?SpO2 96 % 02/25/22 1135  ?Vitals shown include unvalidated device data. ? ?Last Pain:  ?Vitals:  ? 02/25/22 0834  ?TempSrc: Temporal  ?PainSc: 0-No pain  ?   ? ?  ? ?Complications: No notable events documented. ?

## 2022-02-25 NOTE — Evaluation (Signed)
Physical Therapy Evaluation ?Patient Details ?Name: Tracy Hogan ?MRN: [id number] ?DOB: 11-10-1951 ?Today's Date: 02/25/2022 ? ?History of Present Illness ? Pt is a 71 yo F diagnosed with osteoarthritis of right hip and is s/p elective R THA.  PMH includes HTN and angina.  ?Clinical Impression ? Pt was pleasant and motivated to participate during the session and put forth good effort throughout. Pt required no physical assistance during the session and demonstrated good control and stability with transfers and gait.  Pt was able to amb around 10 feet with no adverse symptoms other than mild R hip pain that did not increase with activity.  Pt will benefit from HHPT upon discharge to safely address deficits listed in patient problem list for decreased caregiver assistance and eventual return to PLOF. ? ?   ?   ? ?Recommendations for follow up therapy are one component of a multi-disciplinary discharge planning process, led by the attending physician.  Recommendations may be updated based on patient status, additional functional criteria and insurance authorization. ? ?Follow Up Recommendations Home health PT ? ?  ?Assistance Recommended at Discharge Intermittent Supervision/Assistance  ?Patient can return home with the following ? A little help with walking and/or transfers;A little help with bathing/dressing/bathroom;Assistance with cooking/housework;Assist for transportation;Help with stairs or ramp for entrance ? ?  ?Equipment Recommendations None recommended by PT  ?Recommendations for Other Services ?    ?  ?Functional Status Assessment Patient has had a recent decline in their functional status and demonstrates the ability to make significant improvements in function in a reasonable and predictable amount of time.  ? ?  ?Precautions / Restrictions Precautions ?Precautions: Anterior Hip ?Precaution Booklet Issued: Yes (comment) ?Restrictions ?Weight Bearing Restrictions: Yes ?RLE Weight Bearing: Weight bearing as  tolerated  ? ?  ? ?Mobility ? Bed Mobility ?Overal bed mobility: Modified Independent ?  ?  ?  ?  ?  ?  ?General bed mobility comments: Min extra time and effort only ?  ? ?Transfers ?Overall transfer level: Needs assistance ?Equipment used: Rolling walker (2 wheels) ?Transfers: Sit to/from Stand ?Sit to Stand: Min guard ?  ?  ?  ?  ?  ?General transfer comment: Good control and stability ?  ? ?Ambulation/Gait ?Ambulation/Gait assistance: Min guard ?Gait Distance (Feet): 10 Feet ?Assistive device: Rolling walker (2 wheels) ?Gait Pattern/deviations: Step-to pattern, Decreased step length - left, Decreased stance time - right, Antalgic ?Gait velocity: decreased ?  ?  ?General Gait Details: Mildly antalgic gait pattern but steady without LOB ? ?Stairs ?  ?  ?  ?  ?  ? ?Wheelchair Mobility ?  ? ?Modified Rankin (Stroke Patients Only) ?  ? ?  ? ?Balance Overall balance assessment: Needs assistance ?  ?Sitting balance-Leahy Scale: Normal ?  ?  ?Standing balance support: Bilateral upper extremity supported, During functional activity ?Standing balance-Leahy Scale: Good ?  ?  ?  ?  ?  ?  ?  ?  ?  ?  ?  ?  ?   ? ? ? ?Pertinent Vitals/Pain Pain Assessment ?Pain Assessment: 0-10 ?Pain Score: 2  ?Pain Location: R hip ?Pain Descriptors / Indicators: Sore ?Pain Intervention(s): Repositioned, Premedicated before session, Monitored during session, Ice applied  ? ? ?Home Living Family/patient expects to be discharged to:: Private residence ?Living Arrangements: Other relatives ?Available Help at Discharge: Family;Available 24 hours/day ?Type of Home: House ?Home Access: Stairs to enter ?Entrance Stairs-Rails: Right;Left;Can reach both ?Entrance Stairs-Number of Steps: 2 steps with L rail and then  3 steps with bilateral narrow rails ?  ?Home Layout: One level ?Home Equipment: Rolling Walker (2 wheels);BSC/3in1 ?   ?  ?Prior Function Prior Level of Function : Independent/Modified Independent ?  ?  ?  ?  ?  ?  ?Mobility Comments: Ind  amb without an AD in the home and occasional PRN use of a SPC in the community, one fall when pulled down by her dog that lunged for a squirrel ?ADLs Comments: Ind with ADLs ?  ? ? ?Hand Dominance  ?   ? ?  ?Extremity/Trunk Assessment  ? Upper Extremity Assessment ?Upper Extremity Assessment: Overall WFL for tasks assessed ?  ? ?Lower Extremity Assessment ?Lower Extremity Assessment: RLE deficits/detail ?RLE Deficits / Details: BLE ankle strength, AROM, and sensation to light touch grossly intact ?RLE: Unable to fully assess due to pain ?RLE Sensation: WNL ?  ? ?   ?Communication  ? Communication: No difficulties  ?Cognition Arousal/Alertness: Awake/alert ?Behavior During Therapy: [facility] for tasks assessed/performed ?Overall Cognitive Status: Within Functional Limits for tasks assessed ?  ?  ?  ?  ?  ?  ?  ?  ?  ?  ?  ?  ?  ?  ?  ?  ?  ?  ?  ? ?  ?General Comments   ? ?  ?Exercises Total Joint Exercises ?Ankle Circles/Pumps: AROM, Strengthening, Both, 10 reps ?Long Arc Quad: AROM, Strengthening, Both, 10 reps ?Marching in Standing: AROM, Strengthening, Both, 5 reps, Standing ?Other Exercises ?Other Exercises: HEP edcuation per handout ?Other Exercises: Anterior hip precaution education per handout  ? ?Assessment/Plan  ?  ?PT Assessment Patient needs continued PT services  ?PT Problem List Decreased strength;Decreased activity tolerance;Decreased balance;Decreased mobility;Decreased knowledge of use of DME;Decreased knowledge of precautions;Pain ? ?   ?  ?PT Treatment Interventions DME instruction;Gait training;Stair training;Functional mobility training;Therapeutic activities;Therapeutic exercise;Balance training;Patient/family education   ? ?PT Goals (Current goals can be found in the Care Plan section)  ?Acute Rehab PT Goals ?Patient Stated Goal: To be able to exercise without pain ?PT Goal Formulation: With patient ?Time For Goal Achievement: 03/10/22 ?Potential to Achieve Goals: Good ? ?  ?Frequency BID ?   ? ? ?Co-evaluation   ?  ?  ?  ?  ? ? ?  ?AM-PAC PT "6 Clicks" Mobility  ?Outcome Measure Help needed turning from your back to your side while in a flat bed without using bedrails?: A Little ?Help needed moving from lying on your back to sitting on the side of a flat bed without using bedrails?: A Little ?Help needed moving to and from a bed to a chair (including a wheelchair)?: A Little ?Help needed standing up from a chair using your arms (e.g., wheelchair or bedside chair)?: A Little ?Help needed to walk in hospital room?: A Little ?Help needed climbing 3-5 steps with a railing? : A Little ?6 Click Score: 18 ? ?  ?End of Session Equipment Utilized During Treatment: Gait belt ?Activity Tolerance: Patient tolerated treatment well ?Patient left: in chair;with chair alarm set;with call bell/phone within reach;with SCD's reapplied ?Nurse Communication: Mobility status;Weight bearing status ?PT Visit Diagnosis: Muscle weakness (generalized) (M62.81);Other abnormalities of gait and mobility (R26.89);Pain ?Pain - Right/Left: Right ?Pain - part of body: Hip ?  ? ?Time: 1433-1510 ?PT Time Calculation (min) (ACUTE ONLY): 37 min ? ? ?Charges:   PT Evaluation ?$PT Eval Moderate Complexity: 1 Mod ?PT Treatments ?$Therapeutic Exercise: 8-22 mins ?  ?   ? ?D. Scott Aiya Keach PT,  DPT ?02/25/22, 3:24 PM ? ? ?

## 2022-02-26 DIAGNOSIS — Z79899 Other long term (current) drug therapy: Secondary | ICD-10-CM | POA: Diagnosis not present

## 2022-02-26 DIAGNOSIS — I10 Essential (primary) hypertension: Secondary | ICD-10-CM | POA: Diagnosis not present

## 2022-02-26 DIAGNOSIS — Z7982 Long term (current) use of aspirin: Secondary | ICD-10-CM | POA: Diagnosis not present

## 2022-02-26 DIAGNOSIS — M1611 Unilateral primary osteoarthritis, right hip: Secondary | ICD-10-CM | POA: Diagnosis not present

## 2022-02-26 DIAGNOSIS — R7303 Prediabetes: Secondary | ICD-10-CM | POA: Diagnosis not present

## 2022-02-26 LAB — CBC
HCT: 30.1 % — ABNORMAL LOW (ref 36.0–46.0)
Hemoglobin: 10.3 g/dL — ABNORMAL LOW (ref 12.0–15.0)
MCH: 29.8 pg (ref 26.0–34.0)
MCHC: 34.2 g/dL (ref 30.0–36.0)
MCV: 87 fL (ref 80.0–100.0)
Platelets: 191 10*3/uL (ref 150–400)
RBC: 3.46 MIL/uL — ABNORMAL LOW (ref 3.87–5.11)
RDW: 12.6 % (ref 11.5–15.5)
WBC: 7.5 10*3/uL (ref 4.0–10.5)
nRBC: 0 % (ref 0.0–0.2)

## 2022-02-26 LAB — BASIC METABOLIC PANEL
Anion gap: 7 (ref 5–15)
BUN: 14 mg/dL (ref 8–23)
CO2: 27 mmol/L (ref 22–32)
Calcium: 8.3 mg/dL — ABNORMAL LOW (ref 8.9–10.3)
Chloride: 101 mmol/L (ref 98–111)
Creatinine, Ser: 0.81 mg/dL (ref 0.44–1.00)
GFR, Estimated: >60 mL/min (ref >60)
Glucose, Bld: 143 mg/dL — ABNORMAL HIGH (ref 70–99)
Potassium: 3.4 mmol/L — ABNORMAL LOW (ref 3.5–5.1)
Sodium: 135 mmol/L (ref 135–145)

## 2022-02-26 MED ORDER — HYDROCODONE-ACETAMINOPHEN 5-325 MG PO TABS: 1.0000 | ORAL_TABLET | ORAL | 0 refills | Status: AC | PRN

## 2022-02-26 MED ORDER — DOCUSATE SODIUM 100 MG PO CAPS: 100.0000 mg | ORAL_CAPSULE | Freq: Two times a day (BID) | ORAL | 0 refills | Status: AC

## 2022-02-26 MED ORDER — TRAMADOL HCL 50 MG PO TABS: 50.0000 mg | ORAL_TABLET | Freq: Four times a day (QID) | ORAL | 0 refills | Status: AC | PRN

## 2022-02-26 MED ORDER — ENOXAPARIN SODIUM 40 MG/0.4ML IJ SOSY: 40.0000 mg | PREFILLED_SYRINGE | INTRAMUSCULAR | 0 refills | Status: AC

## 2022-02-26 NOTE — Progress Notes (Signed)
Physical Therapy Treatment ?Patient Details ?Name: Tracy Hogan ?MRN: [id number] ?DOB: 09/23/1951 ?Today's Date: 02/26/2022 ? ? ?History of Present Illness Pt is a 71 yo F diagnosed with osteoarthritis of right hip and is s/p elective R THA.  PMH includes HTN and angina. ? ?  ?PT Comments  ? ? Pt resting in recliner upon PT arrival; pt requesting to toilet prior to stairs training.  During session pt SBA with transfers using RW; SBA with ambulation 160 feet with RW use; and CGA navigating 8 steps with UE support.  Improved stairs technique noted this session.  3/10 R hip pain during sessions activities.  Reviewed home safety and safe car transfers: pt verbalizing appropriate understanding.  Pt appears safe to discharge home from PT standpoint (pt's nurse, PA-C, and MD notified). ?  ?Recommendations for follow up therapy are one component of a multi-disciplinary discharge planning process, led by the attending physician.  Recommendations may be updated based on patient status, additional functional criteria and insurance authorization. ? ?Follow Up Recommendations ? Home health PT ?  ?  ?Assistance Recommended at Discharge Intermittent Supervision/Assistance  ?Patient can return home with the following A little help with walking and/or transfers;A little help with bathing/dressing/bathroom;Assistance with cooking/housework;Assist for transportation;Help with stairs or ramp for entrance ?  ?Equipment Recommendations ? Rolling walker (2 wheels);BSC/3in1  ?  ?Recommendations for Other Services   ? ? ?  ?Precautions / Restrictions Precautions ?Precautions: Anterior Hip;Fall ?Precaution Booklet Issued: Yes (comment) ?Restrictions ?Weight Bearing Restrictions: Yes ?RLE Weight Bearing: Weight bearing as tolerated  ?  ? ?Mobility ? Bed Mobility ?  ?  ?  ?  ? ?Transfers ?Overall transfer level: Needs assistance ?Equipment used: Rolling walker (2 wheels) ?Transfers: Sit to/from Stand ?Sit to Stand: Supervision ?  ?  ?  ?  ?   ?General transfer comment: mild increased effort to stand on own but steady and safe using RW (x2 trials from recliner; x1 trial from [facility] over toilet) ?  ? ?Ambulation/Gait ?Ambulation/Gait assistance: Supervision ?Gait Distance (Feet): 160 Feet ?Assistive device: Rolling walker (2 wheels) ?  ?Gait velocity: decreased ?  ?  ?General Gait Details: antalgic; decreased stance time R LE; wider BOS ? ? ?Stairs ?Stairs: Yes ?Stairs assistance: Min guard ?Stair Management: Step to pattern, Forwards ?Number of Stairs: 8 ?General stair comments: ascended/descended 6 steps B with railings; ascended/descended 2 steps with L railing; no cueing required ? ? ?Wheelchair Mobility ?  ? ?Modified Rankin (Stroke Patients Only) ?  ? ? ?  ?Balance Overall balance assessment: Needs assistance ?Sitting-balance support: No upper extremity supported, Feet supported ?Sitting balance-Leahy Scale: Normal ?Sitting balance - Comments: steady sitting reaching outside BOS ?  ?Standing balance support: No upper extremity supported ?Standing balance-Leahy Scale: Good ?Standing balance comment: steady standing reaching within BOS ?  ?  ?  ?  ?  ?  ?  ?  ?  ?  ?  ?  ? ?  ?Cognition Arousal/Alertness: Awake/alert ?Behavior During Therapy: [facility] for tasks assessed/performed ?Overall Cognitive Status: Within Functional Limits for tasks assessed ?  ?  ?  ?  ?  ?  ?  ?  ?  ?  ?  ?  ?  ?  ?  ?  ?  ?  ?  ? ?  ?Exercises   ?General Comments General comments (skin integrity, edema, etc.): wound vac in place ?  ?  ? ?Pertinent Vitals/Pain Pain Assessment ?Pain Assessment: 0-10 ?Pain Score: 3  ?Pain Location:  R hip ?Pain Descriptors / Indicators: Sore ?Pain Intervention(s): Limited activity within patient's tolerance, Monitored during session, Premedicated before session, Repositioned, Ice applied ?Vitals (HR and O2 on room air) stable and WFL throughout treatment session.  ? ? ?Home Living   ?  ?  ?  ?  ?  ?  ?  ?  ?  ?   ?  ?Prior Function    ?  ?  ?   ? ?PT  Goals (current goals can now be found in the care plan section) Acute Rehab PT Goals ?Patient Stated Goal: To be able to exercise without pain ?PT Goal Formulation: With patient ?Time For Goal Achievement: 03/10/22 ?Potential to Achieve Goals: Good ?Progress towards PT goals: Progressing toward goals ? ?  ?Frequency ? ? ? BID ? ? ? ?  ?PT Plan Current plan remains appropriate  ? ? ?Co-evaluation   ?  ?  ?  ?  ? ?  ?AM-PAC PT "6 Clicks" Mobility   ?Outcome Measure ? Help needed turning from your back to your side while in a flat bed without using bedrails?: A Little ?Help needed moving from lying on your back to sitting on the side of a flat bed without using bedrails?: A Little ?Help needed moving to and from a bed to a chair (including a wheelchair)?: A Little ?Help needed standing up from a chair using your arms (e.g., wheelchair or bedside chair)?: A Little ?Help needed to walk in hospital room?: A Little ?Help needed climbing 3-5 steps with a railing? : A Little ?6 Click Score: 18 ? ?  ?End of Session Equipment Utilized During Treatment: Gait belt ?Activity Tolerance: Patient tolerated treatment well ?Patient left: in chair;with call bell/phone within reach;with chair alarm set;with SCD's reapplied;Other (comment) (B heels floating via pillow support) ?Nurse Communication: Mobility status;Weight bearing status;Patient requests pain meds;Precautions;Other (comment) (pt appears safe to discharge home from PT standpoint) ?PT Visit Diagnosis: Muscle weakness (generalized) (M62.81);Other abnormalities of gait and mobility (R26.89);Pain ?Pain - Right/Left: Right ?Pain - part of body: Hip ?  ? ? ?Time: 1110-1141 ?PT Time Calculation (min) (ACUTE ONLY): 31 min ? ?Charges:  $Gait Training: 23-37 mins         ?          ?Leitha Bleak, PT ?02/26/22, 12:19 PM ? ? ?

## 2022-02-26 NOTE — TOC Transition Note (Signed)
Transition of Care (TOC) - CM/SW Discharge Note ? ? ?Patient Details  ?Name: Tracy Hogan ?MRN: [id number] ?Date of Birth: 1951-04-15 ? ?Transition of Care (TOC) CM/SW Contact:  ?Izola Price, RN ?Phone Number: ?02/26/2022, 1:10 PM ? ? ?Clinical Narrative:  02/26/22: Discharging to home/Home Health, confirmed with [facility]. Contacted patient to confirm discharge plan and patient confirms she had needed DME at home already. Brother to transport patient from hospital per unit RN. Simmie Davies RN CM  ? ? ?Final next level of care: [place] ?Barriers to Discharge: Barriers Resolved ? ? ?Patient Goals and CMS Choice ?  ?  ?  ? ?Discharge Placement ?  ?           ?  ?  ?  ?  ? ?Discharge Plan and Services ?  ?  ?           ?DME Arranged: 3-N-1, Bedside commode, Walker rolling ?DME Agency: [employer] ?Date DME Agency Contacted: 02/26/22 ?Time DME Agency Contacted: 4801 ?Representative spoke with at DME Agency: Delana Meyer (Will deliver to home address unless otherwise documented. Ordered yesterday by provider but not via [employer].) ?HH Arranged: PT ?[place] Agency: [place] ?Date HH Agency Contacted: 02/26/22 ?Time [place]: 6553 ?Representative spoke with at [place]: Confirmed with Tommi Rumps. ? ?Social Determinants of Health (SDOH) Interventions ?  ? ? ?Readmission Risk Interventions ?   ? View : No data to display.  ?  ?  ?  ? ? ? ? ? ?

## 2022-02-26 NOTE — Progress Notes (Signed)
Physical Therapy Treatment ?Patient Details ?Name: Tracy Hogan ?MRN: [id number] ?DOB: [birth year] ?Today's Date: 02/26/2022 ? ? ?History of Present Illness Pt is a 71 yo F diagnosed with osteoarthritis of right hip and is s/p elective R THA.  PMH includes HTN and angina. ? ?  ?PT Comments  ? ? Pt resting in bed upon PT arrival; pt agreeable to PT session.  During session pt min assist semi-supine to/from sitting edge of bed (pt reports her brother lives with her and can provide needed assist for R LE in/out of bed); SBA with transfers using RW; CGA ambulating 120 feet with RW; and CGA navigating 4 steps with B UE support (vc's required for technique).  Stairs training limited d/t pt fatigue (plan to return later for further stairs training).  2-3/10 R hip pain during sessions activities. ?  ?Recommendations for follow up therapy are one component of a multi-disciplinary discharge planning process, led by the attending physician.  Recommendations may be updated based on patient status, additional functional criteria and insurance authorization. ? ?Follow Up Recommendations ? Home health PT ?  ?  ?Assistance Recommended at Discharge Intermittent Supervision/Assistance  ?Patient can return home with the following A little help with walking and/or transfers;A little help with bathing/dressing/bathroom;Assistance with cooking/housework;Assist for transportation;Help with stairs or ramp for entrance ?  ?Equipment Recommendations ? Rolling walker (2 wheels);BSC/3in1  ?  ?Recommendations for Other Services   ? ? ?  ?Precautions / Restrictions Precautions ?Precautions: Anterior Hip;Fall ?Precaution Booklet Issued: Yes (comment) ?Restrictions ?Weight Bearing Restrictions: Yes ?RLE Weight Bearing: Weight bearing as tolerated  ?  ? ?Mobility ? Bed Mobility ?Overal bed mobility: Needs Assistance ?Bed Mobility: Supine to Sit, Sit to Supine ?  ?  ?Supine to sit: Min assist ?Sit to supine: Min assist ?  ?General bed mobility comments:  assist for R LE (pt reports her brother will be able to assist her at home) ?  ? ?Transfers ?Overall transfer level: Needs assistance ?Equipment used: Rolling walker (2 wheels) ?Transfers: Sit to/from Stand ?Sit to Stand: Supervision ?  ?  ?  ?  ?  ?General transfer comment: mild increased effort to stand on own but steady and safe using RW (x1 trial from bed; x1 trial from mat table) ?  ? ?Ambulation/Gait ?Ambulation/Gait assistance: Min guard ?Gait Distance (Feet): 120 Feet ?Assistive device: Rolling walker (2 wheels) ?  ?Gait velocity: decreased ?  ?  ?General Gait Details: antalgic; decreased stance time R LE; wider BOS; initial vc's for positioning within walker ? ? ?Stairs ?Stairs: Yes ?Stairs assistance: Min guard ?Stair Management: Step to pattern, Forwards ?Number of Stairs: 4 ?General stair comments: ascended 2 steps with B railings; ascended 2 steps with L railing; descended 3 steps with B railings; descended 1 step with L railing; vc's for technique and sequencing required ? ? ?Wheelchair Mobility ?  ? ?Modified Rankin (Stroke Patients Only) ?  ? ? ?  ?Balance Overall balance assessment: Needs assistance ?Sitting-balance support: No upper extremity supported, Feet supported ?Sitting balance-Leahy Scale: Normal ?Sitting balance - Comments: steady sitting reaching outside BOS ?  ?Standing balance support: No upper extremity supported ?Standing balance-Leahy Scale: Good ?Standing balance comment: steady standing reaching within BOS ?  ?  ?  ?  ?  ?  ?  ?  ?  ?  ?  ?  ? ?  ?Cognition Arousal/Alertness: Awake/alert ?Behavior During Therapy: [facility] for tasks assessed/performed ?Overall Cognitive Status: Within Functional Limits for tasks assessed ?  ?  ?  ?  ?  ?  ?  ?  ?  ?  ?  ?  ?  ?  ?  ?  ?  ?  ?  ? ?  ?  Exercises Total Joint Exercises ?Ankle Circles/Pumps: AROM, Strengthening, Both, 10 reps, Supine ?Quad Sets: AROM, Strengthening, Both, 10 reps, Supine ?Gluteal Sets: AROM, Strengthening, Both, 10 reps,  Supine ?Short Arc Quad: AROM, Strengthening, Both, 10 reps, Supine ?Heel Slides: AAROM, Strengthening, Right, 10 reps, Supine ?Hip ABduction/ADduction: AAROM, Strengthening, Right, 10 reps, Supine ? ?  ?General Comments General comments (skin integrity, edema, etc.): wound vac in place.  Nursing cleared pt for participation in physical therapy.  Pt agreeable to PT session. ?  ?  ? ?Pertinent Vitals/Pain Pain Assessment ?Pain Assessment: 0-10 ?Pain Score: 3  ?Pain Location: R hip ?Pain Descriptors / Indicators: Sore ?Pain Intervention(s): Limited activity within patient's tolerance, Monitored during session, Premedicated before session, Repositioned, Patient requesting pain meds-RN notified, Ice applied ?Vitals (HR and O2 on room air) stable and WFL throughout treatment session.  ? ? ?Home Living   ?  ?  ?  ?  ?  ?  ?  ?  ?  ?   ?  ?Prior Function    ?  ?  ?   ? ?PT Goals (current goals can now be found in the care plan section) Acute Rehab PT Goals ?Patient Stated Goal: To be able to exercise without pain ?PT Goal Formulation: With patient ?Time For Goal Achievement: 03/10/22 ?Potential to Achieve Goals: Good ?Progress towards PT goals: Progressing toward goals ? ?  ?Frequency ? ? ? BID ? ? ? ?  ?PT Plan Current plan remains appropriate  ? ? ?Co-evaluation   ?  ?  ?  ?  ? ?  ?AM-PAC PT "6 Clicks" Mobility   ?Outcome Measure ? Help needed turning from your back to your side while in a flat bed without using bedrails?: A Little ?Help needed moving from lying on your back to sitting on the side of a flat bed without using bedrails?: A Little ?Help needed moving to and from a bed to a chair (including a wheelchair)?: A Little ?Help needed standing up from a chair using your arms (e.g., wheelchair or bedside chair)?: A Little ?Help needed to walk in hospital room?: A Little ?Help needed climbing 3-5 steps with a railing? : A Little ?6 Click Score: 18 ? ?  ?End of Session Equipment Utilized During Treatment: Gait  belt ?Activity Tolerance: Patient limited by fatigue ?Patient left: in chair;with call bell/phone within reach;with chair alarm set;with SCD's reapplied;Other (comment) (B heels floating via pillow support) ?Nurse Communication: Mobility status;Weight bearing status;Patient requests pain meds;Precautions ?PT Visit Diagnosis: Muscle weakness (generalized) (M62.81);Other abnormalities of gait and mobility (R26.89);Pain ?Pain - Right/Left: Right ?Pain - part of body: Hip ?  ? ? ?Time: 2563-8937 ?PT Time Calculation (min) (ACUTE ONLY): 39 min ? ?Charges:  $Gait Training: 23-37 mins ?$Therapeutic Exercise: 8-22 mins          ?          ?Leitha Bleak, PT ?02/26/22, 8:59 AM ? ? ?

## 2022-02-26 NOTE — Progress Notes (Signed)
0903 ?PT informed nurse that pt needs to be seen again later this morning to complete steps. Nurse will medicate ?

## 2022-02-26 NOTE — Progress Notes (Signed)
?  Subjective: ?1 Day Post-Op Procedure(s) (LRB): ?TOTAL HIP ARTHROPLASTY ANTERIOR APPROACH (Right) ?Patient reports pain as well-controlled.   ?Patient is well, and has had no acute complaints or problems ?Plan is to go Home after hospital stay. ?Negative for chest pain and shortness of breath ?Fever: no ?Gastrointestinal: negative for nausea and vomiting.  ? ?Objective: ?Vital signs in last 24 hours: ?Temp:  [97 ?F (36.1 ?C)-99 ?F (37.2 ?C)] 98.6 ?F (37 ?C) (04/08 0755) ?Pulse Rate:  [42-86] 86 (04/08 0755) ?Resp:  [10-17] 17 (04/08 0755) ?BP: (107-140)/(64-86) 122/68 (04/08 0755) ?SpO2:  [93 %-100 %] 93 % (04/08 0755) ? ?Intake/Output from previous day: ? ?Intake/Output Summary (Last 24 hours) at 02/26/2022 1105 ?Last data filed at 02/26/2022 9407 ?Gross per 24 hour  ?Intake 1990 ml  ?Output 2075 ml  ?Net -85 ml  ?  ?Intake/Output this shift: ?Total I/O ?In: 450 [P.O.:450] ?Out: -  ? ?Labs: ?Recent Labs  ?  02/25/22 ?1350 02/26/22 ?0541  ?HGB 10.6* 10.3*  ? ?Recent Labs  ?  02/25/22 ?1350 02/26/22 ?0541  ?WBC 12.3* 7.5  ?RBC 3.69* 3.46*  ?HCT 33.0* 30.1*  ?PLT 231 191  ? ?Recent Labs  ?  02/25/22 ?1350 02/26/22 ?0541  ?NA  --  135  ?K  --  3.4*  ?CL  --  101  ?CO2  --  27  ?BUN  --  14  ?CREATININE 0.63 0.81  ?GLUCOSE  --  143*  ?CALCIUM  --  8.3*  ? ?No results for input(s): LABPT, INR in the last 72 hours. ? ? ?EXAM ?General - Patient is Alert, Appropriate, and Oriented ?Extremity - Neurovascular intact ?Dorsiflexion/Plantar flexion intact ?Compartment soft ?Dressing/Incision -Prevena in place, no drainage in cannister ?Motor Function - intact, moving foot and toes well on exam.  ? ? ? ?Assessment/Plan: ?1 Day Post-Op Procedure(s) (LRB): ?TOTAL HIP ARTHROPLASTY ANTERIOR APPROACH (Right) ?Principal Problem: ?  Status post total hip replacement, right ? ?Estimated body mass index is 33.8 kg/m? as calculated from the following: ?  Height as of this encounter: 5' 4.5" (1.638 m). ?  Weight as of this encounter: 90.7  kg. ?Advance diet ?Up with therapy ? ?Anticipate d/c this PM pending completion of therapy goals . ? ?Connected  Prevena to home unit.  ? ?DVT Prophylaxis - Lovenox and SCDs, TED hose, ASA 81 ?Weight-Bearing as tolerated to right leg ? ?Cassell Smiles, PA-C ?[facility] ?02/26/2022, 11:05 AM ? ?

## 2022-02-26 NOTE — Plan of Care (Signed)
Patient sleeping between care. Aox4. Minimal pain. Wound vac intact. No new changes in assessment. Call bell within reach. ? ?PLAN OF CARE ONGOING ? ?Problem: Education: ?Goal: Knowledge of the prescribed therapeutic regimen will improve ?Outcome: Progressing ?Goal: Understanding of discharge needs will improve ?Outcome: Progressing ?Goal: Individualized Educational Video(s) ?Outcome: Progressing ?  ?Problem: Activity: ?Goal: Ability to avoid complications of mobility impairment will improve ?Outcome: Progressing ?Goal: Ability to tolerate increased activity will improve ?Outcome: Progressing ?  ?Problem: Clinical Measurements: ?Goal: Postoperative complications will be avoided or minimized ?Outcome: Progressing ?  ?Problem: Pain Management: ?Goal: Pain level will decrease with appropriate interventions ?Outcome: Progressing ?  ?Problem: Skin Integrity: ?Goal: Will show signs of wound healing ?Outcome: Progressing ?  ?Problem: Education: ?Goal: Knowledge of General Education information will improve ?Description: Including pain rating scale, medication(s)/side effects and non-pharmacologic comfort measures ?Outcome: Progressing ?  ?Problem: Health Behavior/Discharge Planning: ?Goal: Ability to manage health-related needs will improve ?Outcome: Progressing ?  ?Problem: Clinical Measurements: ?Goal: Ability to maintain clinical measurements within normal limits will improve ?Outcome: Progressing ?Goal: Will remain free from infection ?Outcome: Progressing ?Goal: Diagnostic test results will improve ?Outcome: Progressing ?Goal: Respiratory complications will improve ?Outcome: Progressing ?Goal: Cardiovascular complication will be avoided ?Outcome: Progressing ?  ?Problem: Activity: ?Goal: Risk for activity intolerance will decrease ?Outcome: Progressing ?  ?Problem: Nutrition: ?Goal: Adequate nutrition will be maintained ?Outcome: Progressing ?  ?Problem: Coping: ?Goal: Level of anxiety will decrease ?Outcome:  Progressing ?  ?Problem: Elimination: ?Goal: Will not experience complications related to bowel motility ?Outcome: Progressing ?Goal: Will not experience complications related to urinary retention ?Outcome: Progressing ?  ?Problem: Pain Managment: ?Goal: General experience of comfort will improve ?Outcome: Progressing ?  ?Problem: Safety: ?Goal: Ability to remain free from injury will improve ?Outcome: Progressing ?  ?Problem: Skin Integrity: ?Goal: Risk for impaired skin integrity will decrease ?Outcome: Progressing ?  ?

## 2022-02-26 NOTE — Discharge Summary (Signed)
?Physician Discharge Summary  ?Patient ID: ?Tracy Hogan ?MRN: [id number] ?DOB/AGE: 73-Sep-1952 73 y.o. ? ?Admit date: 02/25/2022 ?Discharge date: 02/26/2022 ? 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 ?Surgeries:Procedure(s): ?TOTAL HIP ARTHROPLASTY ANTERIOR APPROACH (Right) ?  ?SURGEON: Laurene Footman, MD ?  ?ASSISTANTS: none ?  ?ANESTHESIA:   spinal ?  ?EBL:  No intake/output data recorded. ?  ?BLOOD ADMINISTERED:none ?  ?DRAINS:  Incisional wound VAC   ?  ?LOCAL MEDICATIONS USED:  MARCAINE    and OTHER Exparel ?  ?SPECIMEN:  Source of Specimen:    Right femoral head ?  ?DISPOSITION OF SPECIMEN:  PATHOLOGY ?  ?COUNTS:  YES ?  ?TOURNIQUET:  * No tourniquets in log * ?  ?IMPLANTS: Medacta AMIS 3 standard stem with metal M 28 mm head, 58 mm mpact DM cup and liner ? ?Discharge Diagnoses: ?Patient Active Problem List  ? Diagnosis Date Noted  ? Status post total hip replacement, right 02/25/2022  ? ? ?Past Medical History:  ?Diagnosis Date  ? Anginal pain ([place])   ? Diastolic dysfunction 57/84/6962  ? a.) TTE 08/31/2021: EF 50%; mild LVH, triv TR/MR, mild AR/PR; G2DD.  ? Heart murmur   ? Hypercholesterolemia   ? Hypertension   ? Obesity (BMI 30-39.9)   ? Osteoarthritis   ? Pre-diabetes   ? ?  ?Transfusion:  ?  ?Consultants (if any):  ? ?Discharged Condition: Improved ? ?Hospital Course: Tracy Hogan is an 71 y.o. female who was admitted 02/25/2022 with a diagnosis of right hip osteoarthritis and went to the operating room on 02/25/2022 and underwent right total hip arthroplasty through anterior approach. The patient received perioperative antibiotics for prophylaxis (see below). The patient tolerated the procedure well and was transported to PACU in stable condition. After meeting PACU criteria, the patient was subsequently transferred to the Orthopaedics/Rehabilitation unit.  ? ?The patient received DVT prophylaxis in the form of early mobilization, Aspirin, Lovenox, TED hose, and SCDs . A  sacral pad had been placed and heels were elevated off of the bed with rolled towels in order to protect skin integrity. Foley catheter was discontinued on postoperative day #0. The surgical incision was healing well without signs of infection. ? ?Physical therapy was initiated postoperatively for transfers, gait training, and strengthening. Occupational therapy was initiated for activities of daily living and evaluation for assisted devices. Rehabilitation goals were reviewed in detail with the patient. The patient made steady progress with physical therapy and physical therapy recommended discharge to Home.  ? ?The patient achieved the preliminary goals of this hospitalization and was felt to be medically and orthopaedically appropriate for discharge. ? ?She was given perioperative antibiotics:  ?Anti-infectives (From admission, onward)  ? ? Start     Dose/Rate Route Frequency Ordered Stop  ? 02/25/22 1600  ceFAZolin (ANCEF) IVPB 2g/100 mL premix       ? 2 g ?200 mL/hr over 30 Minutes Intravenous Every 6 hours 02/25/22 1330 02/25/22 2344  ? 02/25/22 0828  ceFAZolin (ANCEF) 2-4 GM/100ML-% IVPB       ?Note to Pharmacy: Maryagnes Amos B: cabinet override  ?    02/25/22 0828 02/25/22 2344  ? 02/25/22 0600  ceFAZolin (ANCEF) IVPB 2g/100 mL premix       ? 2 g ?200 mL/hr over 30 Minutes Intravenous On call to O.R. 02/25/22 0116 02/25/22 1018  ? ?  ?. ? ?Recent vital signs:  ?Vitals:  ? 02/26/22 0426 02/26/22 0755  ?BP: 126/72 122/68  ?Pulse:  86 86  ?Resp: 16 17  ?Temp: 99 ?F (37.2 ?C) 98.6 ?F (37 ?C)  ?SpO2: 95% 93%  ? ? ?Recent laboratory studies:  ?Recent Labs  ?  02/25/22 ?1350 02/26/22 ?0541  ?WBC 12.3* 7.5  ?HGB 10.6* 10.3*  ?HCT 33.0* 30.1*  ?PLT 231 191  ?K  --  3.4*  ?CL  --  101  ?CO2  --  27  ?BUN  --  14  ?CREATININE 0.63 0.81  ?GLUCOSE  --  143*  ?CALCIUM  --  8.3*  ? ? ?Diagnostic Studies: DG C-Arm 1-60 Min-No Report ? ?Result Date: 02/25/2022 ?Fluoroscopy was utilized by the requesting physician.  No  radiographic interpretation.  ? ?DG C-Arm 1-60 Min-No Report ? ?Result Date: 02/25/2022 ?Fluoroscopy was utilized by the requesting physician.  No radiographic interpretation.  ? ?DG HIP UNILAT WITH PELVIS 1V RIGHT ? ?Result Date: 02/25/2022 ?CLINICAL DATA:  RIGHT anterior hip arthroplasty EXAM: DG HIP (WITH OR WITHOUT PELVIS) 1V RIGHT COMPARISON:  None Fluoroscopy dose: 1.5988 mGy cumulative air kerma FINDINGS: Osseous demineralization. Advanced osteoarthritic changes RIGHT hip on initial image. Second image demonstrates RIGHT hip prosthesis without fracture or dislocation; distal extent of femoral component not imaged. IMPRESSION: RIGHT hip prosthesis without acute complication as above. Electronically Signed   By: Lavonia Dana M.D.   On: 02/25/2022 11:27  ? ?DG HIP UNILAT W OR W/O PELVIS 2-3 VIEWS RIGHT ? ?Result Date: 02/25/2022 ?CLINICAL DATA:  71 year old female with a history of right hip arthroplasty EXAM: DG HIP (WITH OR WITHOUT PELVIS) 2-3V RIGHT COMPARISON:  None. FINDINGS: Surgical changes of right hip arthroplasty. Staples within the soft tissues. Gas in the surgical bed. Components appear aligned. IMPRESSION: Early surgical changes of right hip arthroplasty, without complicating features Electronically Signed   By: Corrie Mckusick D.O.   On: 02/25/2022 12:00   ? ?Discharge Medications:   ?Allergies as of 02/26/2022   ? ?   Reactions  ? Latex   ? Unknown reaction, has 2 daughters allergic to latex  ? Simvastatin   ? Frequent urination  ? ?  ? ?  ?Medication List  ?  ? ?TAKE these medications   ? ?aspirin EC 81 MG tablet ?Take 81 mg by mouth daily. Swallow whole. ?  ?AZO-CRANBERRY PO ?Take 1 tablet by mouth daily. ?  ?Cholecalciferol 25 MCG (1000 UT) tablet ?Take 1,000 Units by mouth daily. ?  ?docusate sodium 100 MG capsule ?Commonly known as: COLACE ?Take 1 capsule (100 mg total) by mouth 2 (two) times daily. ?  ?enoxaparin 40 MG/0.4ML injection ?Commonly known as: LOVENOX ?Inject 0.4 mLs (40 mg total) into the  skin daily for 14 days. ?  ?ezetimibe 10 MG tablet ?Commonly known as: ZETIA ?Take 10 mg by mouth daily. ?  ?hydrochlorothiazide 12.5 MG tablet ?Commonly known as: HYDRODIURIL ?Take 12.5 mg by mouth daily. ?  ?HYDROcodone-acetaminophen 5-325 MG tablet ?Commonly known as: NORCO/VICODIN ?Take 1 tablet by mouth every 4 (four) hours as needed for severe pain. ?  ?isosorbide mononitrate 30 MG 24 hr tablet ?Commonly known as: IMDUR ?Take 30 mg by mouth daily. ?  ?lisinopril 20 MG tablet ?Commonly known as: ZESTRIL ?Take 20 mg by mouth daily. ?  ?loratadine 10 MG tablet ?Commonly known as: CLARITIN ?Take 10 mg by mouth daily. ?  ?MAGNESIUM CITRATE PO ?Take 1 tablet by mouth daily. ?  ?metoprolol succinate 25 MG 24 hr tablet ?Commonly known as: TOPROL-XL ?Take 25 mg by mouth daily. ?  ?multivitamin capsule ?Take  1 capsule by mouth daily. ?  ?traMADol 50 MG tablet ?Commonly known as: ULTRAM ?Take 1 tablet (50 mg total) by mouth every 6 (six) hours as needed for moderate pain. ?  ? ?  ? ?  ?  ? ? ?  ?Durable Medical Equipment  ?(From admission, onward)  ?  ? ? ?  ? ?  Start     Ordered  ? 02/25/22 1331  DME Walker rolling  Once       ?Question Answer Comment  ?Walker: With 5 Inch Wheels   ?Patient needs a walker to treat with the following condition Status post total hip replacement, right   ?  ? 02/25/22 1330  ? 02/25/22 1331  DME 3 n 1  Once       ? 02/25/22 1330  ? 02/25/22 1331  DME Bedside commode  Once       ?Question:  Patient needs a bedside commode to treat with the following condition  Answer:  Status post total hip replacement, right  ? 02/25/22 1330  ? ?  ?  ? ?  ? ? ?Disposition: Home with home health PT ? ? ? ? Follow-up Information   ? ? Duanne Guess, PA-C. Schedule an appointment as soon as possible for a visit in 2 week(s).   ?Specialties: Orthopedic Surgery, Emergency Medicine ?Why: staple removal ?Contact information: ?[place] [address] ?[phone] ? ? ?  ?  ? ?  ?  ? ?   ? ? ? ?Tamala Julian, PA-C ?02/26/2022, 11:12 AM ? ? ? ? ? ?

## 2022-02-26 NOTE — Evaluation (Signed)
Occupational Therapy Evaluation ?Patient Details ?Name: Tracy Hogan ?MRN: [id number] ?DOB: 12-Jul-1951 ?Today's Date: 02/26/2022 ? ? ?History of Present Illness Pt is a 71 yo F diagnosed with osteoarthritis of right hip and is s/p elective R THA.  PMH includes HTN and angina.  ? ?Clinical Impression ?  ?Pt seen for OT evaluation this date, POD#1 from above surgery. Pt was independent in all ADLs prior to surgery, however occasionally using a SPC for mobility due to hip pain. Pt is eager to return to PLOF with less pain and improved safety and independence. Pt currently requires Mod A for LB dressing while in seated position due to pain and limited AROM of R hip. Pt is able to perform bed mobility, transfers, toileting, ambulation with RW, all with SUPV. She endorses 3/10 pain at present. Pt instructed in self care skills, falls prevention strategies, home/routines modifications, DME/AE for LB bathing and dressing tasks, and car transfer techniques. Pt verbalizes understanding. She recently cared for her mother following a THA, so is familiar with post-surgery needs. Pt's brother lives with her, and her children live nearby; they will be available to assist pt 24/7 for any needs she has during her rehabilitation. No further OT services required at this time. Will sign off.    ? ?Recommendations for follow up therapy are one component of a multi-disciplinary discharge planning process, led by the attending physician.  Recommendations may be updated based on patient status, additional functional criteria and insurance authorization.  ? ?Follow Up Recommendations ? No OT follow up  ?  ?Assistance Recommended at Discharge Intermittent Supervision/Assistance  ?Patient can return home with the following A lot of help with bathing/dressing/bathroom;A little help with walking and/or transfers;Assist for transportation ? ?  ?Functional Status Assessment ? Patient has had a recent decline in their functional status and  demonstrates the ability to make significant improvements in function in a reasonable and predictable amount of time.  ?Equipment Recommendations ? None recommended by OT  ?  ?Recommendations for Other Services   ? ? ?  ?Precautions / Restrictions Precautions ?Precautions: Anterior Hip;Fall ?Precaution Booklet Issued: Yes (comment) ?Restrictions ?Weight Bearing Restrictions: Yes ?RLE Weight Bearing: Weight bearing as tolerated  ? ?  ? ?Mobility Bed Mobility ?Overal bed mobility: Needs Assistance ?Bed Mobility: Supine to Sit, Sit to Supine ?  ?  ?Supine to sit: Min assist ?Sit to supine: Min assist ?  ?  ?  ? ?Transfers ?Overall transfer level: Needs assistance ?Equipment used: Rolling walker (2 wheels) ?Transfers: Sit to/from Stand ?Sit to Stand: Supervision ?  ?  ?  ?  ?  ?General transfer comment: SUPV for safety, no physical assist necessary. RW ?  ? ?  ?Balance Overall balance assessment: Needs assistance ?Sitting-balance support: No upper extremity supported, Feet supported ?Sitting balance-Leahy Scale: Normal ?  ?  ?Standing balance support: No upper extremity supported ?Standing balance-Leahy Scale: Good ?  ?  ?  ?  ?  ?  ?  ?  ?  ?  ?  ?  ?   ? ?ADL either performed or assessed with clinical judgement  ? ?ADL Overall ADL's : Needs assistance/impaired ?  ?  ?  ?  ?  ?  ?  ?  ?  ?  ?  ?  ?Toilet Transfer: Minimal Teacher, English as a foreign language;Ambulation;Rolling walker (2 wheels) ?  ?Toileting- Clothing Manipulation and Hygiene: Supervision/safety;Sitting/lateral lean ?  ?  ?  ?  ?   ? ? ? ?Vision Patient Visual Report:  No change from baseline ?   ?   ?Perception   ?  ?Praxis   ?  ? ?Pertinent Vitals/Pain Pain Assessment ?Pain Score: 3  ?Pain Location: R hip ?Pain Descriptors / Indicators: Aching ?Pain Intervention(s): Limited activity within patient's tolerance, Repositioned  ? ? ? ?Hand Dominance   ?  ?Extremity/Trunk Assessment Upper Extremity Assessment ?Upper Extremity Assessment: Overall WFL for tasks  assessed ?  ?Lower Extremity Assessment ?Lower Extremity Assessment: RLE deficits/detail ?RLE Deficits / Details: BLE ankle strength, AROM, and sensation to light touch grossly intact ?RLE Sensation: WNL ?  ?  ?  ?Communication Communication ?Communication: No difficulties ?  ?Cognition Arousal/Alertness: Awake/alert ?Behavior During Therapy: [facility] for tasks assessed/performed ?Overall Cognitive Status: Within Functional Limits for tasks assessed ?  ?  ?  ?  ?  ?  ?  ?  ?  ?  ?  ?  ?  ?  ?  ?  ?General Comments: pleasant, alert ?  ?  ?General Comments  wound vac in place ? ?  ?Exercises Other Exercises ?Other Exercises: Educ re: AE for LB dressing, bathing, reaching ?  ?Shoulder Instructions    ? ? ?Home Living Family/patient expects to be discharged to:: Private residence ?Living Arrangements: Other relatives ?Available Help at Discharge: Family;Available 24 hours/day ?Type of Home: House ?Home Access: Stairs to enter ?Entrance Stairs-Number of Steps: 2 steps with L rail and then 3 steps with bilateral narrow rails ?Entrance Stairs-Rails: Right;Left;Can reach both ?Home Layout: One level ?  ?  ?Bathroom Shower/Tub: Walk-in shower ?  ?Bathroom Toilet: Standard ?  ?  ?Home Equipment: Rolling Walker (2 wheels);BSC/3in1 ?  ?  ?  ? ?  ?Prior Functioning/Environment Prior Level of Function : Independent/Modified Independent ?  ?  ?  ?  ?  ?  ?Mobility Comments: Ind amb without an AD in the home and occasional PRN use of a SPC in the community, one fall when pulled down by her dog that lunged for a squirrel ?ADLs Comments: Ind with ADLs ?  ? ?  ?  ?OT Problem List: Decreased range of motion;Decreased activity tolerance;Decreased strength;Decreased knowledge of use of DME or AE;Pain ?  ?   ?OT Treatment/Interventions:    ?  ?OT Goals(Current goals can be found in the care plan section) Acute Rehab OT Goals ?Patient Stated Goal: to be free of hip pain ?OT Goal Formulation: With patient ?Time For Goal Achievement:  03/12/22 ?Potential to Achieve Goals: Good  ?OT Frequency:   ?  ? ?Co-evaluation   ?  ?  ?  ?  ? ?  ?AM-PAC OT "6 Clicks" Daily Activity     ?Outcome Measure Help from another person eating meals?: None ?Help from another person taking care of personal grooming?: None ?Help from another person toileting, which includes using toliet, bedpan, or urinal?: A Little ?Help from another person bathing (including washing, rinsing, drying)?: A Little ?Help from another person to put on and taking off regular upper body clothing?: None ?Help from another person to put on and taking off regular lower body clothing?: A Lot ?6 Click Score: 20 ?  ?End of Session Equipment Utilized During Treatment: Rolling walker (2 wheels) ? ?Activity Tolerance: Patient tolerated treatment well ?Patient left: in chair ? ?OT Visit Diagnosis: Pain;Muscle weakness (generalized) (M62.81) ?Pain - Right/Left: Right ?Pain - part of body: Hip  ?              ?Time: 3734-2876 ?OT Time Calculation (min): 15  min ?Charges:  OT General Charges ?$OT Visit: 1 Visit ?OT Evaluation ?$OT Eval Low Complexity: 1 Low ?OT Treatments ?$Self Care/Home Management : 8-22 mins ?Josiah Lobo, PhD, MS, OTR/L ?02/26/22, 1:51 PM ? ?

## 2022-02-26 NOTE — Progress Notes (Addendum)
1225 ?PT informed nurse that pt has completed stairs and passed PT. Pt ready for d/c  ? ?9244 ?Pt states she has walker and 3:1 commode at home. CM made aware. ?IV removed pt dressed. D/c ave reviewed with pt all questions and concerns answered ?

## 2022-02-28 ENCOUNTER — Encounter: Payer: Self-pay | Admitting: Orthopedic Surgery

## 2022-03-01 LAB — SURGICAL PATHOLOGY

## 2022-03-01 NOTE — Anesthesia Postprocedure Evaluation (Signed)
Anesthesia Post Note ? ?Patient: Tracy Hogan ? ?Procedure(s) Performed: TOTAL HIP ARTHROPLASTY ANTERIOR APPROACH (Right: Hip) ? ?Patient location during evaluation: PACU ?Anesthesia Type: Spinal ?Level of consciousness: awake and alert ?Pain management: pain level controlled ?Vital Signs Assessment: post-procedure vital signs reviewed and stable ?Respiratory status: spontaneous breathing and respiratory function stable ?Cardiovascular status: blood pressure returned to baseline and stable ?Postop Assessment: no backache, no headache and patient able to bend at knees ?Anesthetic complications: no ? ? ?No notable events documented. ? ? ?Last Vitals:  ?Vitals:  ? 02/26/22 0426 02/26/22 0755  ?BP: 126/72 122/68  ?Pulse: 86 86  ?Resp: 16 17  ?Temp: 37.2 ?C 37 ?C  ?SpO2: 95% 93%  ?  ?Last Pain:  ?Vitals:  ? 02/26/22 0949  ?TempSrc:   ?PainSc: 3   ? ? ?  ?  ?  ?  ?  ?  ? ?Martha Clan ? ? ? ? ?

## 2022-04-11 DIAGNOSIS — Z96641 Presence of right artificial hip joint: Secondary | ICD-10-CM | POA: Diagnosis not present

## 2022-04-11 DIAGNOSIS — M1611 Unilateral primary osteoarthritis, right hip: Secondary | ICD-10-CM | POA: Diagnosis not present

## 2022-04-12 ENCOUNTER — Ambulatory Visit
Admission: RE | Admit: 2022-04-12 | Discharge: 2022-04-12 | Disposition: A | Discharge status: Home / Self Care | Payer: Medicare HMO | Source: Ambulatory Visit | Attending: Family Medicine | Admitting: Family Medicine

## 2022-04-12 DIAGNOSIS — Z1231 Encounter for screening mammogram for malignant neoplasm of breast: Secondary | ICD-10-CM | POA: Diagnosis not present

## 2022-04-15 DIAGNOSIS — Z6833 Body mass index (BMI) 33.0-33.9, adult: Secondary | ICD-10-CM | POA: Diagnosis not present

## 2022-04-15 DIAGNOSIS — R079 Chest pain, unspecified: Secondary | ICD-10-CM | POA: Diagnosis not present

## 2022-04-15 DIAGNOSIS — E6609 Other obesity due to excess calories: Secondary | ICD-10-CM | POA: Diagnosis not present

## 2022-04-15 DIAGNOSIS — E7800 Pure hypercholesterolemia, unspecified: Secondary | ICD-10-CM | POA: Diagnosis not present

## 2022-04-15 DIAGNOSIS — I10 Essential (primary) hypertension: Secondary | ICD-10-CM | POA: Diagnosis not present

## 2022-07-13 DIAGNOSIS — E7800 Pure hypercholesterolemia, unspecified: Secondary | ICD-10-CM | POA: Diagnosis not present

## 2022-07-20 DIAGNOSIS — Z6833 Body mass index (BMI) 33.0-33.9, adult: Secondary | ICD-10-CM | POA: Diagnosis not present

## 2022-07-20 DIAGNOSIS — I10 Essential (primary) hypertension: Secondary | ICD-10-CM | POA: Diagnosis not present

## 2022-07-20 DIAGNOSIS — E7800 Pure hypercholesterolemia, unspecified: Secondary | ICD-10-CM | POA: Diagnosis not present

## 2022-07-20 DIAGNOSIS — E6609 Other obesity due to excess calories: Secondary | ICD-10-CM | POA: Diagnosis not present

## 2022-10-17 DIAGNOSIS — Z6833 Body mass index (BMI) 33.0-33.9, adult: Secondary | ICD-10-CM | POA: Diagnosis not present

## 2022-10-17 DIAGNOSIS — E6609 Other obesity due to excess calories: Secondary | ICD-10-CM | POA: Diagnosis not present

## 2022-10-17 DIAGNOSIS — R079 Chest pain, unspecified: Secondary | ICD-10-CM | POA: Diagnosis not present

## 2022-10-17 DIAGNOSIS — E7800 Pure hypercholesterolemia, unspecified: Secondary | ICD-10-CM | POA: Diagnosis not present

## 2022-10-17 DIAGNOSIS — I10 Essential (primary) hypertension: Secondary | ICD-10-CM | POA: Diagnosis not present

## 2022-10-17 DIAGNOSIS — Z23 Encounter for immunization: Secondary | ICD-10-CM | POA: Diagnosis not present

## 2022-11-16 DIAGNOSIS — Z800 Family history of malignant neoplasm of digestive organs: Secondary | ICD-10-CM | POA: Diagnosis not present

## 2022-11-16 DIAGNOSIS — Z86010 Personal history of colonic polyps: Secondary | ICD-10-CM | POA: Diagnosis not present

## 2022-11-16 DIAGNOSIS — K219 Gastro-esophageal reflux disease without esophagitis: Secondary | ICD-10-CM | POA: Diagnosis not present

## 2022-12-23 DIAGNOSIS — N644 Mastodynia: Secondary | ICD-10-CM | POA: Diagnosis not present

## 2022-12-23 DIAGNOSIS — L918 Other hypertrophic disorders of the skin: Secondary | ICD-10-CM | POA: Diagnosis not present

## 2022-12-27 ENCOUNTER — Other Ambulatory Visit: Payer: Self-pay | Admitting: Family Medicine

## 2022-12-27 DIAGNOSIS — N644 Mastodynia: Secondary | ICD-10-CM

## 2022-12-28 ENCOUNTER — Ambulatory Visit
Admission: RE | Admit: 2022-12-28 | Discharge: 2022-12-28 | Disposition: A | Discharge status: Home / Self Care | Payer: Medicare HMO | Source: Ambulatory Visit | Attending: Family Medicine | Admitting: Family Medicine

## 2022-12-28 DIAGNOSIS — N644 Mastodynia: Secondary | ICD-10-CM | POA: Diagnosis not present

## 2023-01-13 DIAGNOSIS — I10 Essential (primary) hypertension: Secondary | ICD-10-CM | POA: Diagnosis not present

## 2023-01-13 DIAGNOSIS — E7800 Pure hypercholesterolemia, unspecified: Secondary | ICD-10-CM | POA: Diagnosis not present

## 2023-01-13 DIAGNOSIS — R7309 Other abnormal glucose: Secondary | ICD-10-CM | POA: Diagnosis not present

## 2023-01-20 DIAGNOSIS — E669 Obesity, unspecified: Secondary | ICD-10-CM | POA: Diagnosis not present

## 2023-01-20 DIAGNOSIS — E785 Hyperlipidemia, unspecified: Secondary | ICD-10-CM | POA: Diagnosis not present

## 2023-01-20 DIAGNOSIS — Z1331 Encounter for screening for depression: Secondary | ICD-10-CM | POA: Diagnosis not present

## 2023-01-20 DIAGNOSIS — Z0000 Encounter for general adult medical examination without abnormal findings: Secondary | ICD-10-CM | POA: Diagnosis not present

## 2023-01-20 DIAGNOSIS — Z6832 Body mass index (BMI) 32.0-32.9, adult: Secondary | ICD-10-CM | POA: Diagnosis not present

## 2023-01-20 DIAGNOSIS — R7303 Prediabetes: Secondary | ICD-10-CM | POA: Diagnosis not present

## 2023-01-20 DIAGNOSIS — I10 Essential (primary) hypertension: Secondary | ICD-10-CM | POA: Diagnosis not present

## 2023-02-10 ENCOUNTER — Encounter: Payer: Self-pay | Admitting: *Deleted

## 2023-02-13 ENCOUNTER — Ambulatory Visit: Payer: Medicare HMO | Admitting: Certified Registered Nurse Anesthetist

## 2023-02-13 ENCOUNTER — Encounter: Payer: Self-pay | Admitting: *Deleted

## 2023-02-13 ENCOUNTER — Ambulatory Visit
Admission: RE | Admit: 2023-02-13 | Discharge: 2023-02-13 | Disposition: A | Discharge status: Home / Self Care | Payer: Medicare HMO | Attending: Gastroenterology | Admitting: Gastroenterology

## 2023-02-13 ENCOUNTER — Encounter
Admission: RE | Disposition: A | Discharge status: Home / Self Care | Payer: Self-pay | Source: Home / Self Care | Attending: Gastroenterology

## 2023-02-13 ENCOUNTER — Other Ambulatory Visit: Payer: Self-pay

## 2023-02-13 DIAGNOSIS — K5730 Diverticulosis of large intestine without perforation or abscess without bleeding: Secondary | ICD-10-CM | POA: Insufficient documentation

## 2023-02-13 DIAGNOSIS — E785 Hyperlipidemia, unspecified: Secondary | ICD-10-CM | POA: Diagnosis not present

## 2023-02-13 DIAGNOSIS — D124 Benign neoplasm of descending colon: Secondary | ICD-10-CM | POA: Diagnosis not present

## 2023-02-13 DIAGNOSIS — Z86010 Personal history of colonic polyps: Secondary | ICD-10-CM | POA: Diagnosis not present

## 2023-02-13 DIAGNOSIS — Z800 Family history of malignant neoplasm of digestive organs: Secondary | ICD-10-CM | POA: Diagnosis not present

## 2023-02-13 DIAGNOSIS — I209 Angina pectoris, unspecified: Secondary | ICD-10-CM | POA: Diagnosis not present

## 2023-02-13 DIAGNOSIS — K640 First degree hemorrhoids: Secondary | ICD-10-CM | POA: Diagnosis not present

## 2023-02-13 DIAGNOSIS — Z6831 Body mass index (BMI) 31.0-31.9, adult: Secondary | ICD-10-CM | POA: Insufficient documentation

## 2023-02-13 DIAGNOSIS — D123 Benign neoplasm of transverse colon: Secondary | ICD-10-CM | POA: Insufficient documentation

## 2023-02-13 DIAGNOSIS — I10 Essential (primary) hypertension: Secondary | ICD-10-CM | POA: Diagnosis not present

## 2023-02-13 DIAGNOSIS — Z09 Encounter for follow-up examination after completed treatment for conditions other than malignant neoplasm: Secondary | ICD-10-CM | POA: Diagnosis not present

## 2023-02-13 DIAGNOSIS — Z1211 Encounter for screening for malignant neoplasm of colon: Secondary | ICD-10-CM | POA: Insufficient documentation

## 2023-02-13 DIAGNOSIS — K649 Unspecified hemorrhoids: Secondary | ICD-10-CM | POA: Diagnosis not present

## 2023-02-13 DIAGNOSIS — E669 Obesity, unspecified: Secondary | ICD-10-CM | POA: Insufficient documentation

## 2023-02-13 DIAGNOSIS — K635 Polyp of colon: Secondary | ICD-10-CM | POA: Diagnosis not present

## 2023-02-13 HISTORY — PX: COLONOSCOPY WITH PROPOFOL: SHX5780

## 2023-02-13 SURGERY — COLONOSCOPY WITH PROPOFOL
Anesthesia: General

## 2023-02-13 MED ORDER — GLYCOPYRROLATE 0.2 MG/ML IJ SOLN
INTRAMUSCULAR | Status: AC
  Filled 2023-02-13: qty 3

## 2023-02-13 MED ORDER — PHENYLEPHRINE 80 MCG/ML (10ML) SYRINGE FOR IV PUSH (FOR BLOOD PRESSURE SUPPORT)
PREFILLED_SYRINGE | INTRAVENOUS | Status: AC
  Filled 2023-02-13: qty 10

## 2023-02-13 MED ORDER — LIDOCAINE HCL (CARDIAC) PF 100 MG/5ML IV SOSY
PREFILLED_SYRINGE | INTRAVENOUS | Status: DC | PRN
  Administered 2023-02-13: 50 mg via INTRAVENOUS

## 2023-02-13 MED ORDER — PROPOFOL 10 MG/ML IV BOLUS
INTRAVENOUS | Status: DC | PRN
  Administered 2023-02-13: 50 mg via INTRAVENOUS

## 2023-02-13 MED ORDER — PROPOFOL 1000 MG/100ML IV EMUL
INTRAVENOUS | Status: AC
  Filled 2023-02-13: qty 100

## 2023-02-13 MED ORDER — PROPOFOL 500 MG/50ML IV EMUL
INTRAVENOUS | Status: DC | PRN
  Administered 2023-02-13: 140 ug/kg/min via INTRAVENOUS

## 2023-02-13 MED ORDER — LIDOCAINE HCL (PF) 2 % IJ SOLN
INTRAMUSCULAR | Status: AC
  Filled 2023-02-13: qty 20

## 2023-02-13 MED ORDER — LIDOCAINE HCL (PF) 2 % IJ SOLN
INTRAMUSCULAR | Status: AC
  Filled 2023-02-13: qty 5

## 2023-02-13 MED ORDER — SODIUM CHLORIDE 0.9 % IV SOLN: INTRAVENOUS | Status: DC

## 2023-02-13 NOTE — Op Note (Signed)
[facility] Gastroenterology Patient Name: Tracy Hogan Procedure Date: 02/13/2023 8:09 AM MRN: [id number] Account #: [id number] Date of Birth: [birth year] Admit Type: Outpatient Age: 72 Room: [facility] ENDO ROOM 3 Gender: Female Note Status: Finalized Instrument Name: Jasper Riling [id number] Procedure:             Colonoscopy Indications:           Surveillance: Personal history of adenomatous polyps                         on last colonoscopy 5 years ago Providers:             Andrey Farmer MD, MD Referring MD:          Dion Body (Referring MD) Medicines:             Monitored Anesthesia Care Complications:         No immediate complications. Estimated blood loss:                         Minimal. Procedure:             Pre-Anesthesia Assessment:                        - Prior to the procedure, a History and Physical was                         performed, and patient medications and allergies were                         reviewed. The patient is competent. The risks and                         benefits of the procedure and the sedation options and                         risks were discussed with the patient. All questions                         were answered and informed consent was obtained.                         Patient identification and proposed procedure were                         verified by the physician, the nurse, the                         anesthesiologist, the anesthetist and the technician                         in the endoscopy suite. Mental Status Examination:                         alert and oriented. Airway Examination: normal                         oropharyngeal airway and neck mobility. Respiratory  Examination: clear to auscultation. CV Examination:                         normal. Prophylactic Antibiotics: The patient does not                         require prophylactic antibiotics. Prior                          Anticoagulants: The patient has taken no anticoagulant                         or antiplatelet agents except for aspirin. ASA Grade                         Assessment: II - A patient with mild systemic disease.                         After reviewing the risks and benefits, the patient                         was deemed in satisfactory condition to undergo the                         procedure. The anesthesia plan was to use monitored                         anesthesia care (MAC). Immediately prior to                         administration of medications, the patient was                         re-assessed for adequacy to receive sedatives. The                         heart rate, respiratory rate, oxygen saturations,                         blood pressure, adequacy of pulmonary ventilation, and                         response to care were monitored throughout the                         procedure. The physical status of the patient was                         re-assessed after the procedure.                        After obtaining informed consent, the colonoscope was                         passed under direct vision. Throughout the procedure,                         the patient's blood pressure, pulse, and oxygen  saturations were monitored continuously. The                         Colonoscope was introduced through the anus and                         advanced to the the cecum, identified by appendiceal                         orifice and ileocecal valve. The colonoscopy was                         performed without difficulty. The patient tolerated                         the procedure well. The quality of the bowel                         preparation was good. The ileocecal valve, appendiceal                         orifice, and rectum were photographed. Findings:      The perianal and digital rectal examinations were normal.      A 3 mm polyp was found in  the transverse colon. The polyp was sessile.       The polyp was removed with a cold snare. Resection and retrieval were       complete. Estimated blood loss was minimal.      An 11 mm polyp was found in the descending colon. The polyp was sessile.       Preparations were made for mucosal resection. Demarcation of the lesion       was performed with narrow band imaging to clearly identify the       boundaries of the lesion. Eleview was injected to raise the lesion.       Snare mucosal resection was performed. Resection and retrieval were       complete. Resected tissue margins were examined and clear of polyp       tissue. To prevent bleeding post-intervention, one hemostatic clip was       successfully placed. There was no bleeding during, or at the end, of the       procedure.      A few small-mouthed diverticula were found in the sigmoid colon.      Internal hemorrhoids were found during retroflexion. The hemorrhoids       were Grade I (internal hemorrhoids that do not prolapse).      The exam was otherwise without abnormality on direct and retroflexion       views. Impression:            - One 3 mm polyp in the transverse colon, removed with                         a cold snare. Resected and retrieved.                        - One 11 mm polyp in the descending colon, removed  with mucosal resection. Resected and retrieved. Clip                         was placed.                        - Diverticulosis in the sigmoid colon.                        - Internal hemorrhoids.                        - The examination was otherwise normal on direct and                         retroflexion views.                        - Mucosal resection was performed. Resection and                         retrieval were complete. Recommendation:        - Discharge patient to home.                        - Resume previous diet.                        - Continue present medications.                         - Await pathology results.                        - Repeat colonoscopy in 3 years for surveillance.                        - Return to referring physician as previously                         scheduled. Procedure Code(s):     --- Professional ---                        [phone], Colonoscopy, flexible; with endoscopic mucosal                         resection                        [id number], 15, Colonoscopy, flexible; with removal of                         tumor(s), polyp(s), or other lesion(s) by snare                         technique Diagnosis Code(s):     --- Professional ---                        Z86.010, Personal history of colonic polyps                        D12.3, Benign neoplasm of transverse colon (  hepatic                         flexure or splenic flexure)                        D12.4, Benign neoplasm of descending colon                        K64.0, First degree hemorrhoids                        K57.30, Diverticulosis of large intestine without                         perforation or abscess without bleeding CPT copyright 2022 [employer]. All rights reserved. The codes documented in this report are preliminary and upon coder review may  be revised to meet current compliance requirements. Andrey Farmer MD, MD 02/13/2023 8:51:33 AM Number of Addenda: 0 Note Initiated On: 02/13/2023 8:09 AM Scope Withdrawal Time: 0 hours 16 minutes 12 seconds  Total Procedure Duration: 0 hours 21 minutes 11 seconds  Estimated Blood Loss:  Estimated blood loss was minimal.      [facility]

## 2023-02-13 NOTE — H&P (Signed)
Outpatient short stay form Pre-procedure 02/13/2023  Tracy Rubenstein, MD  Primary Physician: Dion Body, MD  Reason for visit:  Surveillance colonoscopy  History of present illness:    72 y/o lady with history of hypertension, obesity, and HLD here for colonoscopy. Last colonoscopy in 2018 with small TA. Father with colon cancer in polyp in his 17's. No abdominal surgeries. No blood thinners.    Current Facility-Administered Medications:    0.9 %  sodium chloride infusion, , Intravenous, Continuous, Graceanne Guin, Hilton Cork, MD  Medications Prior to Admission  Medication Sig Dispense Refill Last Dose   aspirin EC 81 MG tablet Take 81 mg by mouth daily. Swallow whole.   Past Week   AZO-CRANBERRY PO Take 1 tablet by mouth daily.   Past Week   Cholecalciferol 1000 units tablet Take 1,000 Units by mouth daily.   Past Week   docusate sodium (COLACE) 100 MG capsule Take 1 capsule (100 mg total) by mouth 2 (two) times daily. 20 capsule 0 Past Week   ezetimibe (ZETIA) 10 MG tablet Take 10 mg by mouth daily.   02/13/2023 at 0600   hydrochlorothiazide (HYDRODIURIL) 12.5 MG tablet Take 12.5 mg by mouth daily.   02/13/2023 at 0600   isosorbide mononitrate (IMDUR) 30 MG 24 hr tablet Take 30 mg by mouth daily.   02/13/2023 at 0600   lisinopril (ZESTRIL) 20 MG tablet Take 20 mg by mouth daily.   02/13/2023 at 0600   loratadine (CLARITIN) 10 MG tablet Take 10 mg by mouth daily.   02/12/2023   MAGNESIUM CITRATE PO Take 1 tablet by mouth daily.   Past Week   metoprolol succinate (TOPROL-XL) 25 MG 24 hr tablet Take 25 mg by mouth daily.   02/13/2023 at 0600   Multiple Vitamin (MULTIVITAMIN) capsule Take 1 capsule by mouth daily.   Past Week   traMADol (ULTRAM) 50 MG tablet Take 1 tablet (50 mg total) by mouth every 6 (six) hours as needed for moderate pain. 30 tablet 0 Past Month   enoxaparin (LOVENOX) 40 MG/0.4ML injection Inject 0.4 mLs (40 mg total) into the skin daily for 14 days. 5.6 mL 0     HYDROcodone-acetaminophen (NORCO/VICODIN) 5-325 MG tablet Take 1 tablet by mouth every 4 (four) hours as needed for severe pain. 30 tablet 0      Allergies  Allergen Reactions   Latex     Unknown reaction, has 2 daughters allergic to latex   Simvastatin     Frequent urination     Past Medical History:  Diagnosis Date   Anginal pain ([place])    Diastolic dysfunction [id number]   a.) TTE 08/31/2021: EF 50%; mild LVH, triv TR/MR, mild AR/PR; G2DD.   Heart murmur    Hypercholesterolemia    Hypertension    Obesity (BMI 30-39.9)    Osteoarthritis    Pre-diabetes     Review of systems:  Otherwise negative.    Physical Exam  Gen: Alert, oriented. Appears stated age.  HEENT: PERRLA. Lungs: No respiratory distress CV: RRR Abd: soft, benign, no masses Ext: No edema    Planned procedures: Proceed with colonoscopy. The patient understands the nature of the planned procedure, indications, risks, alternatives and potential complications including but not limited to bleeding, infection, perforation, damage to internal organs and possible oversedation/side effects from anesthesia. The patient agrees and gives consent to proceed.  Please refer to procedure notes for findings, recommendations and patient disposition/instructions.     Tracy Rubenstein, MD [facility] Gastroenterology

## 2023-02-13 NOTE — Anesthesia Preprocedure Evaluation (Addendum)
Anesthesia Evaluation  Patient identified by MRN, date of birth, ID band Patient awake    Reviewed: Allergy & Precautions, H&P , NPO status , Patient's Chart, lab work & pertinent test results  History of Anesthesia Complications Negative for: history of anesthetic complications  Airway Mallampati: III  TM Distance: <3 FB Neck ROM: limited    Dental  (+) Chipped   Pulmonary neg shortness of breath, neg sleep apnea, neg COPD, Patient abstained from smoking.Not current smoker, former smoker   Pulmonary exam normal breath sounds clear to auscultation       Cardiovascular Exercise Tolerance: Good METShypertension, + angina with exertion (-) CAD and (-) Past MI (-) dysrhythmias  Rhythm:Regular Rate:Normal - Systolic murmurs   Hx angina, though TTE and stress test in 2022 were unremarkable. On Imdur   Neuro/Psych negative neurological ROS  negative psych ROS   GI/Hepatic negative GI ROS, Neg liver ROS,neg GERD  ,,  Endo/Other  negative endocrine ROSneg diabetes    Renal/GU negative Renal ROS  negative genitourinary   Musculoskeletal   Abdominal   Peds  Hematology negative hematology ROS (+)   Anesthesia Other Findings Past Medical History: No date: Hypercholesterolemia No date: Hypertension No date: Obesity (BMI 30-39.9) No date: Pre-diabetes  Past Surgical History: 08/04/2017: BREAST BIOPSY; Left     Comment:  left [place] bx path pending No date: EYE SURGERY No date: TUBAL LIGATION No date: uterine fibroid tumor extraction  BMI    Body Mass Index:  29.45 kg/m      Reproductive/Obstetrics negative OB ROS                             Anesthesia Physical Anesthesia Plan  ASA: 2  Anesthesia Plan: General   Post-op Pain Management: Minimal or no pain anticipated   Induction: Intravenous  PONV Risk Score and Plan: 3 and Propofol infusion  Airway Management Planned: Natural Airway  and Nasal Cannula  Additional Equipment: None  Intra-op Plan:   Post-operative Plan:   Informed Consent: I have reviewed the patients History and Physical, chart, labs and discussed the procedure including the risks, benefits and alternatives for the proposed anesthesia with the patient or authorized representative who has indicated his/her understanding and acceptance.     Dental Advisory Given  Plan Discussed with: Anesthesiologist, CRNA and Surgeon  Anesthesia Plan Comments: (Discussed risks of anesthesia with patient, including possibility of difficulty with spontaneous ventilation under anesthesia necessitating airway intervention, PONV, and rare risks such as cardiac or respiratory or neurological events, and allergic reactions. Discussed the role of CRNA in patient's perioperative care. Patient understands.)        Anesthesia Quick Evaluation

## 2023-02-13 NOTE — Interval H&P Note (Signed)
History and Physical Interval Note:  02/13/2023 8:12 AM  Tracy Hogan  has presented today for surgery, with the diagnosis of HX OF COLON POLYPS.  The various methods of treatment have been discussed with the patient and family. After consideration of risks, benefits and other options for treatment, the patient has consented to  Procedure(s): COLONOSCOPY WITH PROPOFOL (N/A) as a surgical intervention.  The patient's history has been reviewed, patient examined, no change in status, stable for surgery.  I have reviewed the patient's chart and labs.  Questions were answered to the patient's satisfaction.     Lesly Rubenstein  Ok to proceed with colonoscopy

## 2023-02-13 NOTE — Transfer of Care (Signed)
Immediate Anesthesia Transfer of Care Note  Patient: Tracy Hogan  Procedure(s) Performed: COLONOSCOPY WITH PROPOFOL  Patient Location: Endoscopy Unit  Anesthesia Type:General  Level of Consciousness: drowsy  Airway & Oxygen Therapy: Patient Spontanous Breathing  Post-op Assessment: Report given to RN and Post -op Vital signs reviewed and stable  Post vital signs: Reviewed and stable  Last Vitals:  Vitals Value Taken Time  BP 104/58   Temp    Pulse 60 02/13/23 0849  Resp 14 02/13/23 0849  SpO2 97 % 02/13/23 0849  Vitals shown include unvalidated device data.  Last Pain:  Vitals:   02/13/23 0803  TempSrc: Temporal  PainSc: 0-No pain         Complications: No notable events documented.

## 2023-02-13 NOTE — Anesthesia Postprocedure Evaluation (Signed)
Anesthesia Post Note  Patient: Tracy Hogan  Procedure(s) Performed: COLONOSCOPY WITH PROPOFOL  Patient location during evaluation: Endoscopy Anesthesia Type: General Level of consciousness: awake and alert Pain management: pain level controlled Vital Signs Assessment: post-procedure vital signs reviewed and stable Respiratory status: spontaneous breathing, nonlabored ventilation, respiratory function stable and patient connected to nasal cannula oxygen Cardiovascular status: blood pressure returned to baseline and stable Postop Assessment: no apparent nausea or vomiting Anesthetic complications: no   No notable events documented.   Last Vitals:  Vitals:   02/13/23 0803 02/13/23 0848  BP: (!) 134/92 (!) 104/58  Pulse: 70   Resp: 18   Temp: (!) 35.8 C (!) 35.9 C  SpO2: 97%     Last Pain:  Vitals:   02/13/23 0908  TempSrc:   PainSc: 0-No pain                 Arita Miss

## 2023-02-14 ENCOUNTER — Encounter: Payer: Self-pay | Admitting: Gastroenterology

## 2023-02-14 LAB — SURGICAL PATHOLOGY

## 2023-03-16 DIAGNOSIS — L821 Other seborrheic keratosis: Secondary | ICD-10-CM | POA: Diagnosis not present

## 2023-03-16 DIAGNOSIS — R208 Other disturbances of skin sensation: Secondary | ICD-10-CM | POA: Diagnosis not present

## 2023-03-16 DIAGNOSIS — L578 Other skin changes due to chronic exposure to nonionizing radiation: Secondary | ICD-10-CM | POA: Diagnosis not present

## 2023-03-16 DIAGNOSIS — D2261 Melanocytic nevi of right upper limb, including shoulder: Secondary | ICD-10-CM | POA: Diagnosis not present

## 2023-03-16 DIAGNOSIS — L820 Inflamed seborrheic keratosis: Secondary | ICD-10-CM | POA: Diagnosis not present

## 2023-03-16 DIAGNOSIS — D225 Melanocytic nevi of trunk: Secondary | ICD-10-CM | POA: Diagnosis not present

## 2023-03-16 DIAGNOSIS — D2271 Melanocytic nevi of right lower limb, including hip: Secondary | ICD-10-CM | POA: Diagnosis not present

## 2023-03-16 DIAGNOSIS — D2262 Melanocytic nevi of left upper limb, including shoulder: Secondary | ICD-10-CM | POA: Diagnosis not present

## 2023-03-16 DIAGNOSIS — D485 Neoplasm of uncertain behavior of skin: Secondary | ICD-10-CM | POA: Diagnosis not present

## 2023-03-16 DIAGNOSIS — D2272 Melanocytic nevi of left lower limb, including hip: Secondary | ICD-10-CM | POA: Diagnosis not present

## 2023-04-10 DIAGNOSIS — R7303 Prediabetes: Secondary | ICD-10-CM | POA: Diagnosis not present

## 2023-04-10 DIAGNOSIS — I10 Essential (primary) hypertension: Secondary | ICD-10-CM | POA: Diagnosis not present

## 2023-04-10 DIAGNOSIS — R079 Chest pain, unspecified: Secondary | ICD-10-CM | POA: Diagnosis not present

## 2023-04-10 DIAGNOSIS — E7800 Pure hypercholesterolemia, unspecified: Secondary | ICD-10-CM | POA: Diagnosis not present

## 2023-05-02 DIAGNOSIS — E7800 Pure hypercholesterolemia, unspecified: Secondary | ICD-10-CM | POA: Diagnosis not present

## 2023-05-02 DIAGNOSIS — R7303 Prediabetes: Secondary | ICD-10-CM | POA: Diagnosis not present

## 2023-05-09 DIAGNOSIS — I10 Essential (primary) hypertension: Secondary | ICD-10-CM | POA: Diagnosis not present

## 2023-05-09 DIAGNOSIS — R7303 Prediabetes: Secondary | ICD-10-CM | POA: Diagnosis not present

## 2023-05-09 DIAGNOSIS — Z6830 Body mass index (BMI) 30.0-30.9, adult: Secondary | ICD-10-CM | POA: Diagnosis not present

## 2023-05-09 DIAGNOSIS — E7800 Pure hypercholesterolemia, unspecified: Secondary | ICD-10-CM | POA: Diagnosis not present

## 2023-05-09 DIAGNOSIS — E6609 Other obesity due to excess calories: Secondary | ICD-10-CM | POA: Diagnosis not present

## 2023-05-31 ENCOUNTER — Other Ambulatory Visit: Payer: Self-pay | Admitting: Family Medicine

## 2023-05-31 DIAGNOSIS — Z1231 Encounter for screening mammogram for malignant neoplasm of breast: Secondary | ICD-10-CM

## 2023-06-14 ENCOUNTER — Ambulatory Visit
Admission: RE | Admit: 2023-06-14 | Discharge: 2023-06-14 | Disposition: A | Discharge status: Home / Self Care | Payer: Medicare HMO | Source: Ambulatory Visit | Attending: Family Medicine | Admitting: Family Medicine

## 2023-06-14 DIAGNOSIS — Z1231 Encounter for screening mammogram for malignant neoplasm of breast: Secondary | ICD-10-CM | POA: Diagnosis not present

## 2023-08-10 DIAGNOSIS — E119 Type 2 diabetes mellitus without complications: Secondary | ICD-10-CM | POA: Diagnosis not present

## 2023-08-10 DIAGNOSIS — M795 Residual foreign body in soft tissue: Secondary | ICD-10-CM | POA: Diagnosis not present

## 2023-11-02 DIAGNOSIS — I10 Essential (primary) hypertension: Secondary | ICD-10-CM | POA: Diagnosis not present

## 2023-11-02 DIAGNOSIS — E7800 Pure hypercholesterolemia, unspecified: Secondary | ICD-10-CM | POA: Diagnosis not present

## 2023-11-02 DIAGNOSIS — R7303 Prediabetes: Secondary | ICD-10-CM | POA: Diagnosis not present

## 2023-11-09 DIAGNOSIS — K5900 Constipation, unspecified: Secondary | ICD-10-CM | POA: Diagnosis not present

## 2023-11-09 DIAGNOSIS — R7303 Prediabetes: Secondary | ICD-10-CM | POA: Diagnosis not present

## 2023-11-09 DIAGNOSIS — I10 Essential (primary) hypertension: Secondary | ICD-10-CM | POA: Diagnosis not present

## 2023-11-09 DIAGNOSIS — E7800 Pure hypercholesterolemia, unspecified: Secondary | ICD-10-CM | POA: Diagnosis not present

## 2024-04-08 DIAGNOSIS — I499 Cardiac arrhythmia, unspecified: Secondary | ICD-10-CM | POA: Diagnosis not present

## 2024-04-08 DIAGNOSIS — R7303 Prediabetes: Secondary | ICD-10-CM | POA: Diagnosis not present

## 2024-04-08 DIAGNOSIS — R001 Bradycardia, unspecified: Secondary | ICD-10-CM | POA: Diagnosis not present

## 2024-04-08 DIAGNOSIS — E7800 Pure hypercholesterolemia, unspecified: Secondary | ICD-10-CM | POA: Diagnosis not present

## 2024-04-08 DIAGNOSIS — I10 Essential (primary) hypertension: Secondary | ICD-10-CM | POA: Diagnosis not present

## 2024-04-26 DIAGNOSIS — I499 Cardiac arrhythmia, unspecified: Secondary | ICD-10-CM | POA: Diagnosis not present

## 2024-05-08 DIAGNOSIS — I10 Essential (primary) hypertension: Secondary | ICD-10-CM | POA: Diagnosis not present

## 2024-05-08 DIAGNOSIS — R7303 Prediabetes: Secondary | ICD-10-CM | POA: Diagnosis not present

## 2024-05-08 DIAGNOSIS — E7800 Pure hypercholesterolemia, unspecified: Secondary | ICD-10-CM | POA: Diagnosis not present

## 2024-05-15 DIAGNOSIS — Z0000 Encounter for general adult medical examination without abnormal findings: Secondary | ICD-10-CM | POA: Diagnosis not present

## 2024-05-15 DIAGNOSIS — E785 Hyperlipidemia, unspecified: Secondary | ICD-10-CM | POA: Diagnosis not present

## 2024-06-12 ENCOUNTER — Other Ambulatory Visit: Payer: Self-pay | Admitting: Family Medicine

## 2024-06-12 DIAGNOSIS — Z1231 Encounter for screening mammogram for malignant neoplasm of breast: Secondary | ICD-10-CM

## 2024-06-18 ENCOUNTER — Ambulatory Visit
Admission: RE | Admit: 2024-06-18 | Discharge: 2024-06-18 | Disposition: A | Discharge status: Home / Self Care | Source: Ambulatory Visit | Attending: Family Medicine | Admitting: Family Medicine

## 2024-06-18 DIAGNOSIS — Z1231 Encounter for screening mammogram for malignant neoplasm of breast: Secondary | ICD-10-CM | POA: Diagnosis not present

## 2024-06-21 ENCOUNTER — Other Ambulatory Visit: Payer: Self-pay | Admitting: Family Medicine

## 2024-06-21 DIAGNOSIS — R928 Other abnormal and inconclusive findings on diagnostic imaging of breast: Secondary | ICD-10-CM

## 2024-06-24 ENCOUNTER — Other Ambulatory Visit: Payer: Self-pay | Admitting: Family Medicine

## 2024-06-24 ENCOUNTER — Ambulatory Visit
Admission: RE | Admit: 2024-06-24 | Discharge: 2024-06-24 | Disposition: A | Discharge status: Home / Self Care | Source: Ambulatory Visit | Attending: Family Medicine | Admitting: Family Medicine

## 2024-06-24 DIAGNOSIS — R921 Mammographic calcification found on diagnostic imaging of breast: Secondary | ICD-10-CM | POA: Diagnosis not present

## 2024-06-24 DIAGNOSIS — R928 Other abnormal and inconclusive findings on diagnostic imaging of breast: Secondary | ICD-10-CM | POA: Insufficient documentation

## 2024-06-24 DIAGNOSIS — R92321 Mammographic fibroglandular density, right breast: Secondary | ICD-10-CM | POA: Diagnosis not present

## 2024-06-27 ENCOUNTER — Ambulatory Visit
Admission: RE | Admit: 2024-06-27 | Discharge: 2024-06-27 | Disposition: A | Discharge status: Home / Self Care | Source: Ambulatory Visit | Attending: Family Medicine | Admitting: Family Medicine

## 2024-06-27 DIAGNOSIS — N6011 Diffuse cystic mastopathy of right breast: Secondary | ICD-10-CM | POA: Insufficient documentation

## 2024-06-27 DIAGNOSIS — R928 Other abnormal and inconclusive findings on diagnostic imaging of breast: Secondary | ICD-10-CM | POA: Insufficient documentation

## 2024-06-27 DIAGNOSIS — R921 Mammographic calcification found on diagnostic imaging of breast: Secondary | ICD-10-CM | POA: Diagnosis not present

## 2024-06-27 HISTORY — PX: BREAST BIOPSY: SHX20

## 2024-06-27 MED ORDER — LIDOCAINE 1 % OPTIME INJ - NO CHARGE
10.0000 mL | Freq: Once | INTRAMUSCULAR | Status: AC
  Administered 2024-06-27: 10 mL
  Filled 2024-06-27: qty 10

## 2024-06-27 MED ORDER — LIDOCAINE-EPINEPHRINE 1 %-1:100000 IJ SOLN
20.0000 mL | Freq: Once | INTRAMUSCULAR | Status: AC
  Administered 2024-06-27: 20 mL
  Filled 2024-06-27: qty 20

## 2024-06-28 LAB — SURGICAL PATHOLOGY
